# Patient Record
Sex: Female | Born: 1955 | Race: White | Hispanic: No | Marital: Single | Smoking: Former smoker
Health system: Southern US, Community
[De-identification: ages and names within clinical notes are randomized; demographics above are authoritative.]

## PROBLEM LIST (undated history)

## (undated) DIAGNOSIS — G709 Myoneural disorder, unspecified: Secondary | ICD-10-CM

## (undated) DIAGNOSIS — K219 Gastro-esophageal reflux disease without esophagitis: Secondary | ICD-10-CM

## (undated) DIAGNOSIS — K8020 Calculus of gallbladder without cholecystitis without obstruction: Secondary | ICD-10-CM

## (undated) DIAGNOSIS — F1020 Alcohol dependence, uncomplicated: Secondary | ICD-10-CM

## (undated) DIAGNOSIS — I219 Acute myocardial infarction, unspecified: Secondary | ICD-10-CM

## (undated) DIAGNOSIS — M1990 Unspecified osteoarthritis, unspecified site: Secondary | ICD-10-CM

## (undated) DIAGNOSIS — E785 Hyperlipidemia, unspecified: Secondary | ICD-10-CM

## (undated) DIAGNOSIS — H269 Unspecified cataract: Secondary | ICD-10-CM

## (undated) HISTORY — PX: OTHER SURGICAL HISTORY: SHX169

## (undated) HISTORY — DX: Unspecified osteoarthritis, unspecified site: M19.90

## (undated) HISTORY — DX: Unspecified cataract: H26.9

## (undated) HISTORY — DX: Hyperlipidemia, unspecified: E78.5

## (undated) HISTORY — DX: Alcohol dependence, uncomplicated: F10.20

## (undated) HISTORY — DX: Myoneural disorder, unspecified: G70.9

## (undated) HISTORY — DX: Gastro-esophageal reflux disease without esophagitis: K21.9

## (undated) HISTORY — DX: Acute myocardial infarction, unspecified: I21.9

## (undated) HISTORY — PX: TONSILLECTOMY: SUR1361

## (undated) HISTORY — PX: CATARACT EXTRACTION: SUR2

## (undated) HISTORY — DX: Calculus of gallbladder without cholecystitis without obstruction: K80.20

## (undated) HISTORY — PX: COLONOSCOPY: SHX174

## (undated) HISTORY — PX: CHOLECYSTECTOMY: SHX55

---

## 2016-07-04 ENCOUNTER — Telehealth: Payer: Self-pay

## 2016-07-04 NOTE — Telephone Encounter (Signed)
Pre visit call completed 

## 2016-07-06 ENCOUNTER — Ambulatory Visit (INDEPENDENT_AMBULATORY_CARE_PROVIDER_SITE_OTHER): Payer: PRIVATE HEALTH INSURANCE | Admitting: Medical

## 2016-07-06 ENCOUNTER — Ambulatory Visit (HOSPITAL_BASED_OUTPATIENT_CLINIC_OR_DEPARTMENT_OTHER)
Admission: RE | Admit: 2016-07-06 | Discharge: 2016-07-06 | Disposition: A | Discharge status: Home / Self Care | Payer: No Typology Code available for payment source | Source: Ambulatory Visit | Attending: Medical | Admitting: Medical

## 2016-07-06 ENCOUNTER — Encounter: Payer: Self-pay | Admitting: Medical

## 2016-07-06 ENCOUNTER — Telehealth: Payer: Self-pay | Admitting: Medical

## 2016-07-06 VITALS — BP 145/71 | HR 68 | Temp 98.0°F | Resp 16 | Ht 63.0 in | Wt 132.8 lb

## 2016-07-06 DIAGNOSIS — J189 Pneumonia, unspecified organism: Secondary | ICD-10-CM

## 2016-07-06 DIAGNOSIS — R918 Other nonspecific abnormal finding of lung field: Secondary | ICD-10-CM | POA: Insufficient documentation

## 2016-07-06 DIAGNOSIS — R0781 Pleurodynia: Secondary | ICD-10-CM | POA: Insufficient documentation

## 2016-07-06 DIAGNOSIS — I700 Atherosclerosis of aorta: Secondary | ICD-10-CM | POA: Diagnosis not present

## 2016-07-06 DIAGNOSIS — F17200 Nicotine dependence, unspecified, uncomplicated: Secondary | ICD-10-CM | POA: Diagnosis not present

## 2016-07-06 DIAGNOSIS — J984 Other disorders of lung: Secondary | ICD-10-CM

## 2016-07-06 DIAGNOSIS — R0789 Other chest pain: Secondary | ICD-10-CM

## 2016-07-06 DIAGNOSIS — J181 Lobar pneumonia, unspecified organism: Secondary | ICD-10-CM

## 2016-07-06 LAB — D-DIMER, QUANTITATIVE: D-Dimer, Quant: 0.46 mcg/mL FEU (ref <0.50)

## 2016-07-06 LAB — TROPONIN I: TNIDX: 0 ug/l (ref 0.00–0.06)

## 2016-07-06 MED ORDER — DOXYCYCLINE HYCLATE 100 MG PO TABS: 100.0000 mg | ORAL_TABLET | Freq: Two times a day (BID) | ORAL | 0 refills | Status: DC

## 2016-07-06 NOTE — Telephone Encounter (Signed)
I printed pt doxycyline. She will call us and give us name of pharmacy. Will you send in the rx to pharmacy

## 2016-07-06 NOTE — Telephone Encounter (Signed)
Remind pt that we need to do cxr in 3 weeks. Let her know she can go directly to radiology dept down stairs in 3 weeks. Document she was advised. Ask her to follow up with me in at least 3 weeks.

## 2016-07-06 NOTE — Progress Notes (Signed)
Subjective:    Patient ID: Ashley Juarez, female    DOB: 1956/01/12, 61 y.o.   MRN: [id number]  HPI  I have reviewed pt PMH, PSH, FH, Social History and Surgical History  Pt states just moved here.(last pcp was in [place]).  Pt has history of raynauds- describes cold ext at time. No use of amlodipine.  Arthritis- pain mostly in her hands. (mom had arthritis)  Genella Rife- very mild per pt.. Takes omeprazole 1 tab a day.otc dose.  Pt states on Friday she had episode of rt side of chest when she breath deep. Pain was occurring with each breath. Pain started 5 am. Lasted until about 12 noon(not returned since then). She took ibuprofen  2 tablet 6 am and 2 around 11 am. Pt states pain has not come back. No left side pain. At time of pain no nausea, no vomiting, left jaw pain, left shoulder pain or arm pain. At the time she had painful breathing she thought heart was racing. Pt did not get seen. New to area and she was concerned UC and other facilities would not take her insurance so she did not get evaluated.   Pt does not history of partial collapsed lung. This was following pneumonia.          Review of Systems  Constitutional: Negative for chills, fatigue and fever.  HENT: Positive for congestion and postnasal drip. Negative for ear pain.   Respiratory: Negative for cough, choking, shortness of breath and wheezing.        Pleuritic pain and short of breath on friday  Cardiovascular: Positive for chest pain. Negative for palpitations.  Gastrointestinal: Negative for abdominal pain and anal bleeding.  Musculoskeletal: Negative for back pain and gait problem.  Skin: Negative for rash.  Neurological: Negative for dizziness and headaches.  Hematological: Negative for adenopathy. Does not bruise/bleed easily.  Psychiatric/Behavioral: Negative for behavioral problems and confusion.    Past Medical History:  Diagnosis Date  . Arthritis   . Cataract   . GERD (gastroesophageal reflux  disease)      Social History   Social History  . Marital status: Single    Spouse name: N/A  . Number of children: N/A  . Years of education: N/A   Occupational History  . Not on file.   Social History Main Topics  . Smoking status: Current Every Day Smoker    Types: Cigarettes  . Smokeless tobacco: Never Used  . Alcohol use Yes     Comment: daily use. 2-3 glasses at nght.  . Drug use: No  . Sexual activity: Not on file   Other Topics Concern  . Not on file   Social History Narrative  . No narrative on file    Past Surgical History:  Procedure Laterality Date  . CATARACT EXTRACTION Bilateral   . CHOLECYSTECTOMY    . Fatty Tumor     Removed from left ankle.  . TONSILLECTOMY      Family History  Problem Relation Age of Onset  . Heart disease Mother   . Heart disease Father     No Known Allergies  No current outpatient prescriptions on file prior to visit.   No current facility-administered medications on file prior to visit.     BP (!) 145/71 (BP Location: Left Arm, Patient Position: Sitting, Cuff Size: Normal)   Pulse 68   Temp 98 F (36.7 C) (Other (Comment))   Resp 16   Ht 5\' 3"  (1.6 m)  Wt 132 lb 12.8 oz (60.2 kg)   SpO2 99%   BMI 23.52 kg/m      Objective:   Physical Exam   General  Mental Status - Alert. General Appearance - Well groomed. Not in acute distress.  Skin Rashes- No Rashes.  HEENT Head- Normal. Ear Auditory Canal - Left- Normal. Right - Normal.Tympanic Membrane- Left- Normal. Right- Normal. Eye Sclera/Conjunctiva- Left- Normal. Right- Normal. Nose & Sinuses Nasal Mucosa- Left-  Boggy and Congested. Right-  Boggy and  Congested.Bilateral maxillary and frontal sinus pressure. Mouth & Throat Lips: Upper Lip- Normal: no dryness, cracking, pallor, cyanosis, or vesicular eruption. Lower Lip-Normal: no dryness, cracking, pallor, cyanosis or vesicular eruption. Buccal Mucosa- Bilateral- No Aphthous ulcers. Oropharynx- No  Discharge or Erythema. Tonsils: Characteristics- Bilateral- No Erythema or Congestion. Size/Enlargement- Bilateral- No enlargement. Discharge- bilateral-None.  Neck Neck- Supple. No Masses.   Chest and Lung Exam Auscultation: Breath Sounds:-Clear even and unlabored.(on walk  Down hall o2 sat sustained 97%. Pulse 68) After walk about the same.  Cardiovascular Auscultation:Rythm- Regular, rate and rhythm. Murmurs & Other Heart Sounds:Ausculatation of the heart reveal- No Murmurs.  Lymphatic Head & Neck General Head & Neck Lymphatics: Bilateral: Description- No Localized lymphadenopathy.  Lower ext- no pedal edema, calfs symmetric. Negative homans.  Anterior thorax- no chest wall pain on palpation.      Assessment & Plan:  [phone]. For your recent painful breath, hx of lung collapse one year ago, and fast heart rate on Friday we did ekg today. ekg showed good/normal rhythm. Will get cxr today, troponin level and d-dimer.  If you have recurrent symptoms as before then ED evaluation.  We will call you with results. If d-dimer + then will schedule ct chest and US of legs.(provided still aysmptomatic)  If troponin + will advise ED evaluation.  Please stop smoking/reduce.  Follow up in one week or as needed(8-9 am or 1-2 pm recommended appointment slots)

## 2016-07-06 NOTE — Patient Instructions (Addendum)
For your recent painful breath, hx of lung collapse one year ago, and fast heart rate on Friday we did ekg today. ekg showed good/normal rhythm. Will get cxr today, troponin level and d-dimer.  If you have recurrent symptoms as before then ED evaluation.  We will call you with results. If d-dimer + then will schedule ct chest and US of legs.(provided still aysmptomatic)  If troponin + will advise ED evaluation.  Please stop smoking/reduce.  Follow up in one week or as needed(8-9 am or 1-2 pm recommended appointment slots)

## 2016-07-06 NOTE — Progress Notes (Signed)
Pre visit review using our clinic review tool, if applicable. No additional management support is needed unless otherwise documented below in the visit note. 

## 2016-07-07 NOTE — Telephone Encounter (Signed)
Pt request [facility] pharmacy  [address], [place], [place] [id number].   Rx faxed to pharmacy [phone]

## 2016-07-07 NOTE — Telephone Encounter (Signed)
Notified pt. Pt voiced understanding.  

## 2016-07-11 ENCOUNTER — Telehealth: Payer: Self-pay | Admitting: Medical

## 2016-07-11 MED ORDER — DOXYCYCLINE HYCLATE 100 MG PO TABS: 100.0000 mg | ORAL_TABLET | Freq: Two times a day (BID) | ORAL | 0 refills | Status: DC

## 2016-07-11 MED FILL — DOXYCYCLINE HYCLATE 100 MG: 100 | 10 days supply | Qty: 20 | Fill #0

## 2016-07-11 NOTE — Telephone Encounter (Signed)
Caller name:Portia Brittian Relationship to patient: Can be reached:[phone] Pharmacy:[facility] [address]  Reason for call:unable to afford Doxycycline, request something else be called in

## 2016-07-11 NOTE — Telephone Encounter (Signed)
Sent to our pharmacy. I talked with pharmacy and they can fill for $9. Notified pt and she will pick up the rx tomorrow morning.

## 2016-07-14 ENCOUNTER — Ambulatory Visit: Payer: PRIVATE HEALTH INSURANCE | Admitting: Medical

## 2016-07-29 ENCOUNTER — Ambulatory Visit (HOSPITAL_BASED_OUTPATIENT_CLINIC_OR_DEPARTMENT_OTHER)
Admission: RE | Admit: 2016-07-29 | Discharge: 2016-07-29 | Disposition: A | Discharge status: Home / Self Care | Payer: No Typology Code available for payment source | Source: Ambulatory Visit | Attending: Medical | Admitting: Medical

## 2016-07-29 DIAGNOSIS — J181 Lobar pneumonia, unspecified organism: Secondary | ICD-10-CM | POA: Insufficient documentation

## 2016-07-29 DIAGNOSIS — J189 Pneumonia, unspecified organism: Secondary | ICD-10-CM

## 2016-07-29 DIAGNOSIS — J984 Other disorders of lung: Secondary | ICD-10-CM | POA: Diagnosis present

## 2018-04-17 ENCOUNTER — Encounter: Payer: Self-pay | Admitting: Family Medicine

## 2018-04-17 ENCOUNTER — Ambulatory Visit: Payer: Self-pay | Admitting: *Deleted

## 2018-04-17 ENCOUNTER — Ambulatory Visit
Admission: EM | Admit: 2018-04-17 | Discharge: 2018-04-17 | Disposition: A | Discharge status: Home / Self Care | Payer: BLUE CROSS/BLUE SHIELD | Attending: Family Medicine | Admitting: Family Medicine

## 2018-04-17 ENCOUNTER — Ambulatory Visit (INDEPENDENT_AMBULATORY_CARE_PROVIDER_SITE_OTHER): Payer: BLUE CROSS/BLUE SHIELD

## 2018-04-17 DIAGNOSIS — S29011A Strain of muscle and tendon of front wall of thorax, initial encounter: Secondary | ICD-10-CM

## 2018-04-17 MED ORDER — CYCLOBENZAPRINE HCL 5 MG PO TABS: 5.0000 mg | ORAL_TABLET | Freq: Every day | ORAL | 0 refills | Status: DC

## 2018-04-17 MED ORDER — BENZONATATE 100 MG PO CAPS: 100.0000 mg | ORAL_CAPSULE | Freq: Three times a day (TID) | ORAL | 0 refills | Status: DC | PRN

## 2018-04-17 MED ORDER — PREDNISONE 20 MG PO TABS: ORAL_TABLET | ORAL | 0 refills | Status: DC

## 2018-04-17 MED ORDER — OSELTAMIVIR PHOSPHATE 75 MG PO CAPS: 75.0000 mg | ORAL_CAPSULE | Freq: Two times a day (BID) | ORAL | 0 refills | Status: DC

## 2018-04-17 NOTE — Telephone Encounter (Signed)
Patient is calling to report she is having pain in R lung. Patient thought she had pulled muscle and has been using heating pad- not better. Patient state she has been having the pain since last TH/FR. Patient refuses to go to ED- call to office- per [place] any provider in practice would send patient downstairs to ED- patient not pleased with disposition and states she is trying to avoid a big bill- she hung up on me.   Reason for Disposition . Taking a deep breath makes pain worse  Answer Assessment - Initial Assessment Questions 1. LOCATION: "Where does it hurt?"       R side- in side 2. RADIATION: "Does the pain go anywhere else?" (e.g., into neck, jaw, arms, back)     No 3. ONSET: "When did the chest pain begin?" (Minutes, hours or days)      TH/FR 4. PATTERN "Does the pain come and go, or has it been constant since it started?"  "Does it get worse with exertion?"      Comes and goes- only with deep breath, no 5. DURATION: "How long does it last" (e.g., seconds, minutes, hours)     Momentary when patient takes deep breath 6. SEVERITY: "How bad is the pain?"  (e.g., Scale 1-10; mild, moderate, or severe)    - MILD (1-3): doesn't interfere with normal activities     - MODERATE (4-7): interferes with normal activities or awakens from sleep    - SEVERE (8-10): excruciating pain, unable to do any normal activities       5-6 7. CARDIAC RISK FACTORS: "Do you have any history of heart problems or risk factors for heart disease?" (e.g., prior heart attack, angina; high blood pressure, diabetes, being overweight, high cholesterol, smoking, or strong family history of heart disease)     no 8. PULMONARY RISK FACTORS: "Do you have any history of lung disease?"  (e.g., blood clots in lung, asthma, emphysema, birth control pills)     Hx of partially collapsed lung on same side 9. CAUSE: "What do you think is causing the chest pain?"     Possible reoccurrence of lung collapse 10. OTHER SYMPTOMS:  "Do you have any other symptoms?" (e.g., dizziness, nausea, vomiting, sweating, fever, difficulty breathing, cough)       No- pain with deep breath only 11. PREGNANCY: "Is there any chance you are pregnant?" "When was your last menstrual period?"       n/a  Protocols used: CHEST PAIN-A-AH

## 2018-04-17 NOTE — ED Triage Notes (Signed)
Pt c/o rt side muscle strain since last Friday after moving a dresser

## 2018-04-17 NOTE — Telephone Encounter (Signed)
Reviewed chart and she was seen in ED today.

## 2018-04-17 NOTE — ED Provider Notes (Signed)
[facility]    CSN: [id number] Arrival date & time: 04/17/18  1304     History   Chief Complaint Chief Complaint  Patient presents with  . muscle strain    HPI Ashley Juarez is a 63 y.o. female.   63 yo ex-smoker making initial visit to [facility] with right sided chest pain, worse with deep breath.  She quit smoking 4 months ago.  Has dry cough.  Chest pain worse with deep breath.  No fever or productive cough.  No fever or shortness of breath.  Chest pain onset associated with lifting furniture.  Patient has h/o pneumothorax.     Past Medical History:  Diagnosis Date  . Arthritis   . Cataract   . GERD (gastroesophageal reflux disease)     There are no active problems to display for this patient.   Past Surgical History:  Procedure Laterality Date  . CATARACT EXTRACTION Bilateral   . CHOLECYSTECTOMY    . Fatty Tumor     Removed from left ankle.  . TONSILLECTOMY      OB History   No obstetric history on file.      Home Medications    Prior to Admission medications   Medication Sig Start Date End Date Taking? Authorizing Provider  benzonatate (TESSALON) 100 MG capsule Take 1-2 capsules (100-200 mg total) by mouth 3 (three) times daily as needed for cough. 04/17/18   Elvina Sidle, MD  cyclobenzaprine (FLEXERIL) 5 MG tablet Take 1 tablet (5 mg total) by mouth at bedtime. 04/17/18   Elvina Sidle, MD  oseltamivir (TAMIFLU) 75 MG capsule Take 1 capsule (75 mg total) by mouth every 12 (twelve) hours. 04/17/18   Elvina Sidle, MD  predniSONE (DELTASONE) 20 MG tablet Two daily with food 04/17/18   Elvina Sidle, MD    Family History Family History  Problem Relation Age of Onset  . Heart disease Mother   . Heart disease Father     Social History Social History   Tobacco Use  . Smoking status: Current Every Day Smoker    Packs/day: 1.50    Types: Cigarettes  . Smokeless tobacco: Never Used  Substance Use Topics  . Alcohol  use: Yes    Comment: daily use. 2-3 glasses at nght.  . Drug use: No     Allergies   Patient has no known allergies.   Review of Systems Review of Systems   Physical Exam Triage Vital Signs ED Triage Vitals  Enc Vitals Group     BP 04/17/18 1311 (!) 179/92     Pulse Rate 04/17/18 1311 83     Resp 04/17/18 1311 18     Temp 04/17/18 1311 (!) 97.5 F (36.4 C)     Temp Source 04/17/18 1311 Oral     SpO2 04/17/18 1311 96 %     Weight --      Height --      Head Circumference --      Peak Flow --      Pain Score 04/17/18 1312 5     Pain Loc --      Pain Edu? --      Excl. in [place]? --    No data found.  Updated Vital Signs BP (!) 157/79 (BP Location: Left Arm)   Pulse 83   Temp (!) 97.5 F (36.4 C) (Oral)   Resp 18   SpO2 96%    Physical Exam Vitals signs and nursing note reviewed.  Constitutional:      General: She is not in acute distress.    Appearance: Normal appearance. She is normal weight. She is not ill-appearing or toxic-appearing.  HENT:     Head: Normocephalic.     Right Ear: Tympanic membrane normal.     Left Ear: Tympanic membrane normal.     Mouth/Throat:     Mouth: Mucous membranes are moist.  Eyes:     Conjunctiva/sclera: Conjunctivae normal.     Pupils: Pupils are equal, round, and reactive to light.  Cardiovascular:     Rate and Rhythm: Normal rate and regular rhythm.     Pulses: Normal pulses.     Heart sounds: Normal heart sounds.  Pulmonary:     Effort: Pulmonary effort is normal.     Breath sounds: Normal breath sounds.  Musculoskeletal: Normal range of motion.  Skin:    General: Skin is warm and dry.  Neurological:     General: No focal deficit present.     Mental Status: She is alert and oriented to person, place, and time.  Psychiatric:        Mood and Affect: Mood normal.        Behavior: Behavior normal.      UC Treatments / Results  Labs (all labs ordered are listed, but only abnormal results are displayed) Labs  Reviewed - No data to display  EKG None  Radiology No results found.  Procedures Procedures (including critical care time)  Medications Ordered in UC Medications - No data to display  Initial Impression / Assessment and Plan / UC Course  I have reviewed the triage vital signs and the nursing notes.  Pertinent labs & imaging results that were available during my care of the patient were reviewed by me and considered in my medical decision making (see chart for details).     Final Clinical Impressions(s) / UC Diagnoses   Final diagnoses:  Muscle strain of chest wall, initial encounter   Discharge Instructions   None    ED Prescriptions    Medication Sig Dispense Auth. Provider   benzonatate (TESSALON) 100 MG capsule Take 1-2 capsules (100-200 mg total) by mouth 3 (three) times daily as needed for cough. 40 capsule Elvina Sidle, MD   cyclobenzaprine (FLEXERIL) 5 MG tablet Take 1 tablet (5 mg total) by mouth at bedtime. 7 tablet Elvina Sidle, MD   predniSONE (DELTASONE) 20 MG tablet Two daily with food 10 tablet Elvina Sidle, MD   oseltamivir (TAMIFLU) 75 MG capsule Take 1 capsule (75 mg total) by mouth every 12 (twelve) hours. 10 capsule Elvina Sidle, MD     Controlled Substance Prescriptions [place] Controlled Substance Registry consulted? Not Applicable   Elvina Sidle, MD 04/17/18 [phone]

## 2018-08-24 ENCOUNTER — Telehealth: Payer: Self-pay | Admitting: Emergency Medicine

## 2018-08-24 MED ORDER — BENZONATATE 100 MG PO CAPS: 100.0000 mg | ORAL_CAPSULE | Freq: Three times a day (TID) | ORAL | 0 refills | Status: DC | PRN

## 2018-08-24 NOTE — Telephone Encounter (Signed)
Patient called requesting a refill on her medications from her visit in February.  Reviewed with APP [facility]) and discussed with patient.  Will refill Tessalon, discussed return precautions and indications for possible steroid, and need to be seen if she believes she might need it.  Patient verbalized understanding, pharmacy confirmed, and prescription sent.

## 2018-09-18 ENCOUNTER — Ambulatory Visit
Admission: EM | Admit: 2018-09-18 | Discharge: 2018-09-18 | Disposition: A | Discharge status: Home / Self Care | Payer: BLUE CROSS/BLUE SHIELD | Attending: Physician Assistant | Admitting: Physician Assistant

## 2018-09-18 ENCOUNTER — Other Ambulatory Visit: Payer: Self-pay

## 2018-09-18 DIAGNOSIS — R0789 Other chest pain: Secondary | ICD-10-CM

## 2018-09-18 MED ORDER — HYDROCOD POLST-CPM POLST ER 10-8 MG/5ML PO SUER: 5.0000 mL | Freq: Every evening | ORAL | 0 refills | Status: DC | PRN

## 2018-09-18 MED ORDER — PREDNISONE 50 MG PO TABS: 50.0000 mg | ORAL_TABLET | Freq: Every day | ORAL | 0 refills | Status: DC

## 2018-09-18 MED ORDER — BENZONATATE 100 MG PO CAPS: 100.0000 mg | ORAL_CAPSULE | Freq: Three times a day (TID) | ORAL | 0 refills | Status: DC

## 2018-09-18 MED ORDER — CYCLOBENZAPRINE HCL 5 MG PO TABS: 5.0000 mg | ORAL_TABLET | Freq: Two times a day (BID) | ORAL | 0 refills | Status: AC | PRN
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
## 2018-09-18 NOTE — Discharge Instructions (Addendum)
No alarming signs on exam. Start tessalon for cough. Tussionex as needed for cough at night. Prednisone for inflammation. Flexeril as needed, this can make you drowsy, so do not take if you are going to drive, operate heavy machinery, or make important decisions. Ice/heat compresses as needed. This can take up to 3-4 weeks to completely resolve, but you should be feeling better each week. If you experience sudden worsening of chest pain, or sudden onset of shortness of breath, go to the ED for further evaluation needed.
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
## 2018-09-20 ENCOUNTER — Telehealth: Payer: Self-pay | Admitting: Emergency Medicine
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
## 2018-09-23 ENCOUNTER — Other Ambulatory Visit: Payer: Self-pay

## 2018-09-23 ENCOUNTER — Emergency Department (HOSPITAL_BASED_OUTPATIENT_CLINIC_OR_DEPARTMENT_OTHER): Payer: BLUE CROSS/BLUE SHIELD

## 2018-09-23 ENCOUNTER — Emergency Department (HOSPITAL_BASED_OUTPATIENT_CLINIC_OR_DEPARTMENT_OTHER)
Admission: EM | Admit: 2018-09-23 | Discharge: 2018-09-23 | Disposition: A | Discharge status: Home / Self Care | Payer: BLUE CROSS/BLUE SHIELD | Attending: Emergency Medicine | Admitting: Emergency Medicine

## 2018-09-23 ENCOUNTER — Encounter (HOSPITAL_BASED_OUTPATIENT_CLINIC_OR_DEPARTMENT_OTHER): Payer: Self-pay | Admitting: Emergency Medicine

## 2018-09-23 DIAGNOSIS — Z87891 Personal history of nicotine dependence: Secondary | ICD-10-CM | POA: Diagnosis not present

## 2018-09-23 DIAGNOSIS — R0789 Other chest pain: Secondary | ICD-10-CM | POA: Insufficient documentation

## 2018-09-23 DIAGNOSIS — Z79899 Other long term (current) drug therapy: Secondary | ICD-10-CM | POA: Insufficient documentation

## 2018-09-23 DIAGNOSIS — R079 Chest pain, unspecified: Secondary | ICD-10-CM | POA: Diagnosis present

## 2018-09-23 MED ORDER — HYDROCODONE-ACETAMINOPHEN 5-325 MG PO TABS: 1.0000 | ORAL_TABLET | Freq: Four times a day (QID) | ORAL | 0 refills | Status: DC | PRN

## 2018-09-23 MED ORDER — BENZONATATE 100 MG PO CAPS: 100.0000 mg | ORAL_CAPSULE | Freq: Three times a day (TID) | ORAL | 0 refills | Status: AC

## 2018-09-23 NOTE — Discharge Instructions (Signed)
Continue taking the Aleve and try lidocaine patches or heating pad.  Also you can use the pain medicine as needed.  If you develop worsening pain, shortness of breath, fever or vomiting you need to follow-up with your doctor sooner or return to the emergency room.

## 2018-09-23 NOTE — ED Notes (Signed)
Went to discharge patient, pt requesting tessalon perles and cough medicine to help her sleep. MD made aware at this time.

## 2018-09-23 NOTE — ED Notes (Signed)
ED Provider at bedside. 
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
## 2018-09-25 ENCOUNTER — Emergency Department (HOSPITAL_BASED_OUTPATIENT_CLINIC_OR_DEPARTMENT_OTHER)
Admission: EM | Admit: 2018-09-25 | Discharge: 2018-09-25 | Disposition: A | Discharge status: Home / Self Care | Payer: BLUE CROSS/BLUE SHIELD | Attending: Emergency Medicine | Admitting: Emergency Medicine

## 2018-09-25 ENCOUNTER — Other Ambulatory Visit: Payer: Self-pay

## 2018-09-25 ENCOUNTER — Encounter (HOSPITAL_BASED_OUTPATIENT_CLINIC_OR_DEPARTMENT_OTHER): Payer: Self-pay | Admitting: *Deleted

## 2018-09-25 DIAGNOSIS — R109 Unspecified abdominal pain: Secondary | ICD-10-CM | POA: Insufficient documentation

## 2018-09-25 DIAGNOSIS — Z5321 Procedure and treatment not carried out due to patient leaving prior to being seen by health care provider: Secondary | ICD-10-CM | POA: Insufficient documentation

## 2018-09-25 DIAGNOSIS — R1084 Generalized abdominal pain: Secondary | ICD-10-CM
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
## 2018-09-25 NOTE — ED Provider Notes (Signed)
Attempted to see pt in room x3 and she was not in the room. I was informed by nursing staff that patient left the ED. I did not establish care with her.    Bishop Dublin 09/25/18 [place], Julie, MD 09/30/18 [phone]

## 2018-09-25 NOTE — ED Notes (Signed)
Patient left while she was in the room stating that she don't want to wait anymore.

## 2018-09-26 ENCOUNTER — Ambulatory Visit (INDEPENDENT_AMBULATORY_CARE_PROVIDER_SITE_OTHER): Payer: BLUE CROSS/BLUE SHIELD | Admitting: Medical

## 2018-09-26 ENCOUNTER — Encounter: Payer: Self-pay | Admitting: Medical

## 2018-09-26 VITALS — BP 164/98 | HR 91 | Temp 97.7°F | Resp 16 | Ht 63.0 in | Wt 124.4 lb

## 2018-09-26 DIAGNOSIS — R1013 Epigastric pain: Secondary | ICD-10-CM | POA: Diagnosis not present

## 2018-09-26 DIAGNOSIS — M940 Chondrocostal junction syndrome [Tietze]: Secondary | ICD-10-CM | POA: Diagnosis not present

## 2018-09-26 LAB — COMPREHENSIVE METABOLIC PANEL
ALT: 43 U/L — ABNORMAL HIGH (ref 0–35)
AST: 33 U/L (ref 0–37)
Albumin: 4.2 g/dL (ref 3.5–5.2)
Alkaline Phosphatase: 58 U/L (ref 39–117)
BUN: 17 mg/dL (ref 6–23)
CO2: 26 mEq/L (ref 19–32)
Calcium: 9.8 mg/dL (ref 8.4–10.5)
Chloride: 94 mEq/L — ABNORMAL LOW (ref 96–112)
Creatinine, Ser: 0.83 mg/dL (ref 0.40–1.20)
GFR: 69.81 mL/min (ref >60.00)
Glucose, Bld: 104 mg/dL — ABNORMAL HIGH (ref 70–99)
Potassium: 3.4 mEq/L — ABNORMAL LOW (ref 3.5–5.1)
Sodium: 131 mEq/L — ABNORMAL LOW (ref 135–145)
Total Bilirubin: 0.4 mg/dL (ref 0.2–1.2)
Total Protein: 7.8 g/dL (ref 6.0–8.3)

## 2018-09-26 LAB — CBC WITH DIFFERENTIAL/PLATELET
Basophils Absolute: 0 10*3/uL (ref 0.0–0.1)
Basophils Relative: 0.3 % (ref 0.0–3.0)
Eosinophils Absolute: 0.1 10*3/uL (ref 0.0–0.7)
Eosinophils Relative: 0.9 % (ref 0.0–5.0)
HCT: 48.8 % — ABNORMAL HIGH (ref 36.0–46.0)
Hemoglobin: 16.8 g/dL — ABNORMAL HIGH (ref 12.0–15.0)
Lymphocytes Relative: 8.9 % — ABNORMAL LOW (ref 12.0–46.0)
Lymphs Abs: 0.9 10*3/uL (ref 0.7–4.0)
MCHC: 34.4 g/dL (ref 30.0–36.0)
MCV: 108.5 fl — ABNORMAL HIGH (ref 78.0–100.0)
Monocytes Absolute: 0.9 10*3/uL (ref 0.1–1.0)
Monocytes Relative: 9 % (ref 3.0–12.0)
Neutro Abs: 8.3 10*3/uL — ABNORMAL HIGH (ref 1.4–7.7)
Neutrophils Relative %: 80.9 % — ABNORMAL HIGH (ref 43.0–77.0)
Platelets: 232 10*3/uL (ref 150.0–400.0)
RBC: 4.5 Mil/uL (ref 3.87–5.11)
RDW: 13.1 % (ref 11.5–15.5)
WBC: 10.2 10*3/uL (ref 4.0–10.5)

## 2018-09-26 LAB — AMYLASE: Amylase: 19 U/L — ABNORMAL LOW (ref 27–131)

## 2018-09-26 LAB — LIPASE: Lipase: 8 U/L — ABNORMAL LOW (ref 11.0–59.0)

## 2018-09-26 MED ORDER — FAMOTIDINE 20 MG PO TABS: 20.0000 mg | ORAL_TABLET | Freq: Every day | ORAL | 3 refills | Status: AC

## 2018-09-26 MED ORDER — FAMOTIDINE 20 MG PO TABS
20.0000 mg | ORAL_TABLET | Freq: Every day | ORAL | 3 refills | Status: DC
Start: 2018-09-26 — End: 2018-09-26

## 2018-09-26 MED FILL — FAMOTIDINE 20 MG TABLET: 20 | 30 days supply | Qty: 30 | Fill #0
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
## 2018-09-27 ENCOUNTER — Telehealth: Payer: Self-pay | Admitting: Medical

## 2018-09-27 DIAGNOSIS — R1013 Epigastric pain: Secondary | ICD-10-CM

## 2018-09-27 MED ORDER — SUCRALFATE 1 G PO TABS: 1.0000 g | ORAL_TABLET | Freq: Three times a day (TID) | ORAL | 0 refills | Status: AC

## 2018-09-27 NOTE — Telephone Encounter (Signed)
Referral to gi placed. °

## 2018-09-28 ENCOUNTER — Encounter (HOSPITAL_BASED_OUTPATIENT_CLINIC_OR_DEPARTMENT_OTHER): Payer: Self-pay

## 2018-09-28 ENCOUNTER — Telehealth: Payer: Self-pay | Admitting: Medical

## 2018-09-28 ENCOUNTER — Other Ambulatory Visit: Payer: Self-pay

## 2018-09-28 ENCOUNTER — Telehealth: Payer: Self-pay | Admitting: *Deleted

## 2018-09-28 ENCOUNTER — Emergency Department (HOSPITAL_BASED_OUTPATIENT_CLINIC_OR_DEPARTMENT_OTHER)
Admission: EM | Admit: 2018-09-28 | Discharge: 2018-09-28 | Disposition: A | Discharge status: Home / Self Care | Payer: BLUE CROSS/BLUE SHIELD | Attending: Emergency Medicine | Admitting: Emergency Medicine

## 2018-09-28 ENCOUNTER — Telehealth: Payer: Self-pay | Admitting: Gastroenterology

## 2018-09-28 DIAGNOSIS — Z87891 Personal history of nicotine dependence: Secondary | ICD-10-CM | POA: Diagnosis not present

## 2018-09-28 DIAGNOSIS — R1013 Epigastric pain: Secondary | ICD-10-CM | POA: Diagnosis present

## 2018-09-28 DIAGNOSIS — K2900 Acute gastritis without bleeding: Secondary | ICD-10-CM | POA: Diagnosis not present

## 2018-09-28 DIAGNOSIS — Z79899 Other long term (current) drug therapy: Secondary | ICD-10-CM | POA: Diagnosis not present

## 2018-09-28 LAB — URINALYSIS, ROUTINE W REFLEX MICROSCOPIC
Glucose, UA: NEGATIVE mg/dL
Hgb urine dipstick: NEGATIVE
Ketones, ur: 40 mg/dL — AB
Leukocytes,Ua: NEGATIVE
Nitrite: NEGATIVE
Protein, ur: NEGATIVE mg/dL
Specific Gravity, Urine: >1.03 — ABNORMAL HIGH (ref 1.005–1.030)
pH: 5.5 (ref 5.0–8.0)

## 2018-09-28 MED ORDER — ALUM & MAG HYDROXIDE-SIMETH 200-200-20 MG/5ML PO SUSP
30.0000 mL | Freq: Once | ORAL | Status: AC
  Administered 2018-09-28: 15:00:00 30 mL via ORAL
  Filled 2018-09-28: qty 30

## 2018-09-28 MED ORDER — LIDOCAINE VISCOUS HCL 2 % MT SOLN
15.0000 mL | Freq: Once | OROMUCOSAL | Status: AC
  Administered 2018-09-28: 15:00:00 15 mL via ORAL
  Filled 2018-09-28: qty 15

## 2018-09-28 NOTE — ED Triage Notes (Signed)
Pt c/o abd pain x 6 days-seen by PCP this week-states blood work done-NAD-steady gait

## 2018-09-28 NOTE — Telephone Encounter (Signed)
Dr. Chales AbrahamsGupta had a cancellation on Monday 10/01/2018, called and offered patient that appointment.  She declined and said she was on her way to ED.  I also offered her the appointment just in case she may still need to be seen after being seen in ED and she declined.                  ----- Message ----- From: Chrystie NoseHunt, Linda R, RN Sent: 09/28/2018   9:01 AM EDT To: Charlie PitterHenry L Danis III, MD   Please see [facility] and advise scheduling.  Provider Comments    09/27/2018 1:19 PM  Saguier, Ramon DredgeEdward, PA-C Summary: Provider Comments Attachment: - Note              Pt has recent severe abdomen pain. Hx of gerd. She was on omeprazole. Around time took prednisone for costochondritis and hydrocodone abdomen pain worsened.. Pt had resolution of symptoms for about one hour after GI cocktail. Then symptoms started to return but not as severe as before. Can pt be seen quickly by early next week if possible. Her discomfort is significant. I think needs egd.       ----- Message ----- From: Karna ChristmasHarris, Christy D Sent: 09/28/2018   8:27 AM EDT To: Chrystie NoseLinda R Hunt, RN  Bonita QuinLinda,  Dr. Myrtie Neitheranis was the DOD for 09/27/2018 in the PM. Could you advise scheduling please?  Thanks Neysa BonitoChristy     Date Time Type Summary User  09/28/2018 8:29 AM EDT General Auto: Referral message Karna ChristmasHARRIS, CHRISTY D  Note Text:  ----- Message ----- From: Karna ChristmasHarris, Christy D Sent: 09/28/2018   8:27 AM EDT To: Chrystie NoseLinda R Hunt, RN  Bonita QuinLinda,  Dr. Myrtie Neitheranis was the DOD for 09/27/2018 in the PM. Could you advise scheduling please?  Thanks Neysa Bonitohristy    Date Time Type Summary User  09/27/2018 3:36 PM EDT General Auto: Referral message Terressa KoyanagiGRANT, BRIANA E  Note Text:  ----- Message ----- From: Johnney KillianGrant, Briana E, RN Sent: 09/27/2018   3:34 PM EDT To: Holli Humbleshristy D Harris Subject: DOD not Dr. Barron Alvineirigliano today                   Dr. Myrtie Neitheranis is the DOD today. Do you still want me to ask Dr. Barron Alvineirigliano about this? Thank you Bre  ----- Message  ----- From: Karna ChristmasHarris, Christy D Sent: 09/27/2018   3:13 PM EDT To: Johnney KillianBriana E Grant, RN  Hi Bri,  Will you see attached referral.  Dr. Barron Alvineirigliano is DOD for today and Pt has no gi hx. Please advise scheduling?  Requesting that they be seen as early next week.  Thanks Neysa Bonitohristy     Date Time Type Summary User  09/27/2018 3:14 PM EDT General Auto: Referral message Karna ChristmasHARRIS, CHRISTY D  Note Text:  ----- Message ----- From: Karna ChristmasHarris, Christy D Sent: 09/27/2018   3:13 PM EDT To: Johnney KillianBriana E Grant, RN  Hi Bri,  Will you see attached referral.  Dr. Barron Alvineirigliano is DOD for today and Pt has no gi hx. Please advise scheduling?  Requesting that they be seen as early next week.  Thanks Neysa Bonitohristy    Date Time Type Summary User  09/27/2018 1:19 PM EDT Provider Comments Provider Comments Gwenevere AbbotSAGUIER, EDWARD M  Note Text:  Pt has recent severe abdomen pain. Hx of gerd. She was on omeprazole. Around time took prednisone for costochondritis and hydrocodone abdomen pain worsened.. Pt had resolution of symptoms for about one hour after GI cocktail. Then symptoms started  to return but not as severe as before. Can pt be seen quickly by early next week if possible. Her discomfort is significant. I think needs egd.

## 2018-09-28 NOTE — Discharge Instructions (Addendum)
Pick up the Carafate at your pharmacy and start taking that.  Continue taking the omeprazole.  Continue a bland, low acid diet.  Follow-up next week with the gastroenterologist.  Return to the emergency room if you have any worsening symptoms.

## 2018-09-28 NOTE — Telephone Encounter (Signed)
FYI I spoke with patient this morning about her getting worse and what medications to take.  She called back and states that she has gotten worse and she is now heading to the ER.  I gave her the number to the GI that we referred her to and she stated that they were not able to get her in until next week.

## 2018-09-28 NOTE — ED Notes (Signed)
C/o abd pain x 6 days  Denies n/v/d, denies ua sx,  No vag dc  Has been seen for same  Had blood work done 2 days ago

## 2018-09-28 NOTE — Telephone Encounter (Signed)
Noted  
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
## 2018-09-28 NOTE — Telephone Encounter (Signed)
Copied from [place] [id number]. Topic: General - Inquiry >> Sep 28, 2018  8:35 AM Reyne Dumas L wrote: Reason for CRM:  Pt states she was called with her lab results yesterday and asked the nurse a question.  She states the nurse told her she would be intouch with provider and then call her right back.  Pt hasn't heard anything back and is worried/upset.  Pt would like a call as soon as possible.

## 2018-09-28 NOTE — ED Notes (Signed)
ED Provider at bedside. 

## 2018-09-28 NOTE — Telephone Encounter (Signed)
See lab note.  

## 2018-09-29 NOTE — Telephone Encounter (Signed)
Saw ED note. She should be starting carafate soon. So hopefully this will help pending that appointment.

## 2018-10-01 ENCOUNTER — Encounter: Payer: Self-pay | Admitting: Gastroenterology

## 2018-10-01 ENCOUNTER — Other Ambulatory Visit: Payer: Self-pay

## 2018-10-01 ENCOUNTER — Ambulatory Visit (INDEPENDENT_AMBULATORY_CARE_PROVIDER_SITE_OTHER): Payer: BLUE CROSS/BLUE SHIELD | Admitting: Gastroenterology

## 2018-10-01 ENCOUNTER — Telehealth: Payer: Self-pay

## 2018-10-01 VITALS — BP 130/84 | HR 80 | Temp 97.7°F | Ht 63.0 in | Wt 126.1 lb

## 2018-10-01 DIAGNOSIS — R1013 Epigastric pain: Secondary | ICD-10-CM | POA: Diagnosis not present

## 2018-10-01 NOTE — Patient Instructions (Signed)
If you are age 61 or older, your body mass index should be between 23-30. Your Body mass index is 22.34 kg/m. If this is out of the aforementioned range listed, please consider follow up with your Primary Care Provider.  If you are age 37 or younger, your body mass index should be between 19-25. Your Body mass index is 22.34 kg/m. If this is out of the aformentioned range listed, please consider follow up with your Primary Care Provider.   Please follow up in 6 months.   Thank you,  Dr. Jackquline Denmark
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
## 2018-10-01 NOTE — Telephone Encounter (Signed)
Covid-19 screening questions   Do you now or have you had a fever in the last 14 days? No  Do you have any respiratory symptoms of shortness of breath or cough now or in the last 14 days? No  Do you have any family members or close contacts with diagnosed or suspected Covid-19 in the past 14 days? No  Have you been tested for Covid-19 and found to be positive? No       s 

## 2020-05-18 IMAGING — DX DG CHEST 2V
2 series · 2 of 2 positions shown · non-contrast
Comparison: Radiographs July 29, 2016.

CLINICAL DATA: Chest pain, cough.

EXAM:
CHEST - 2 VIEW

[chest pa]
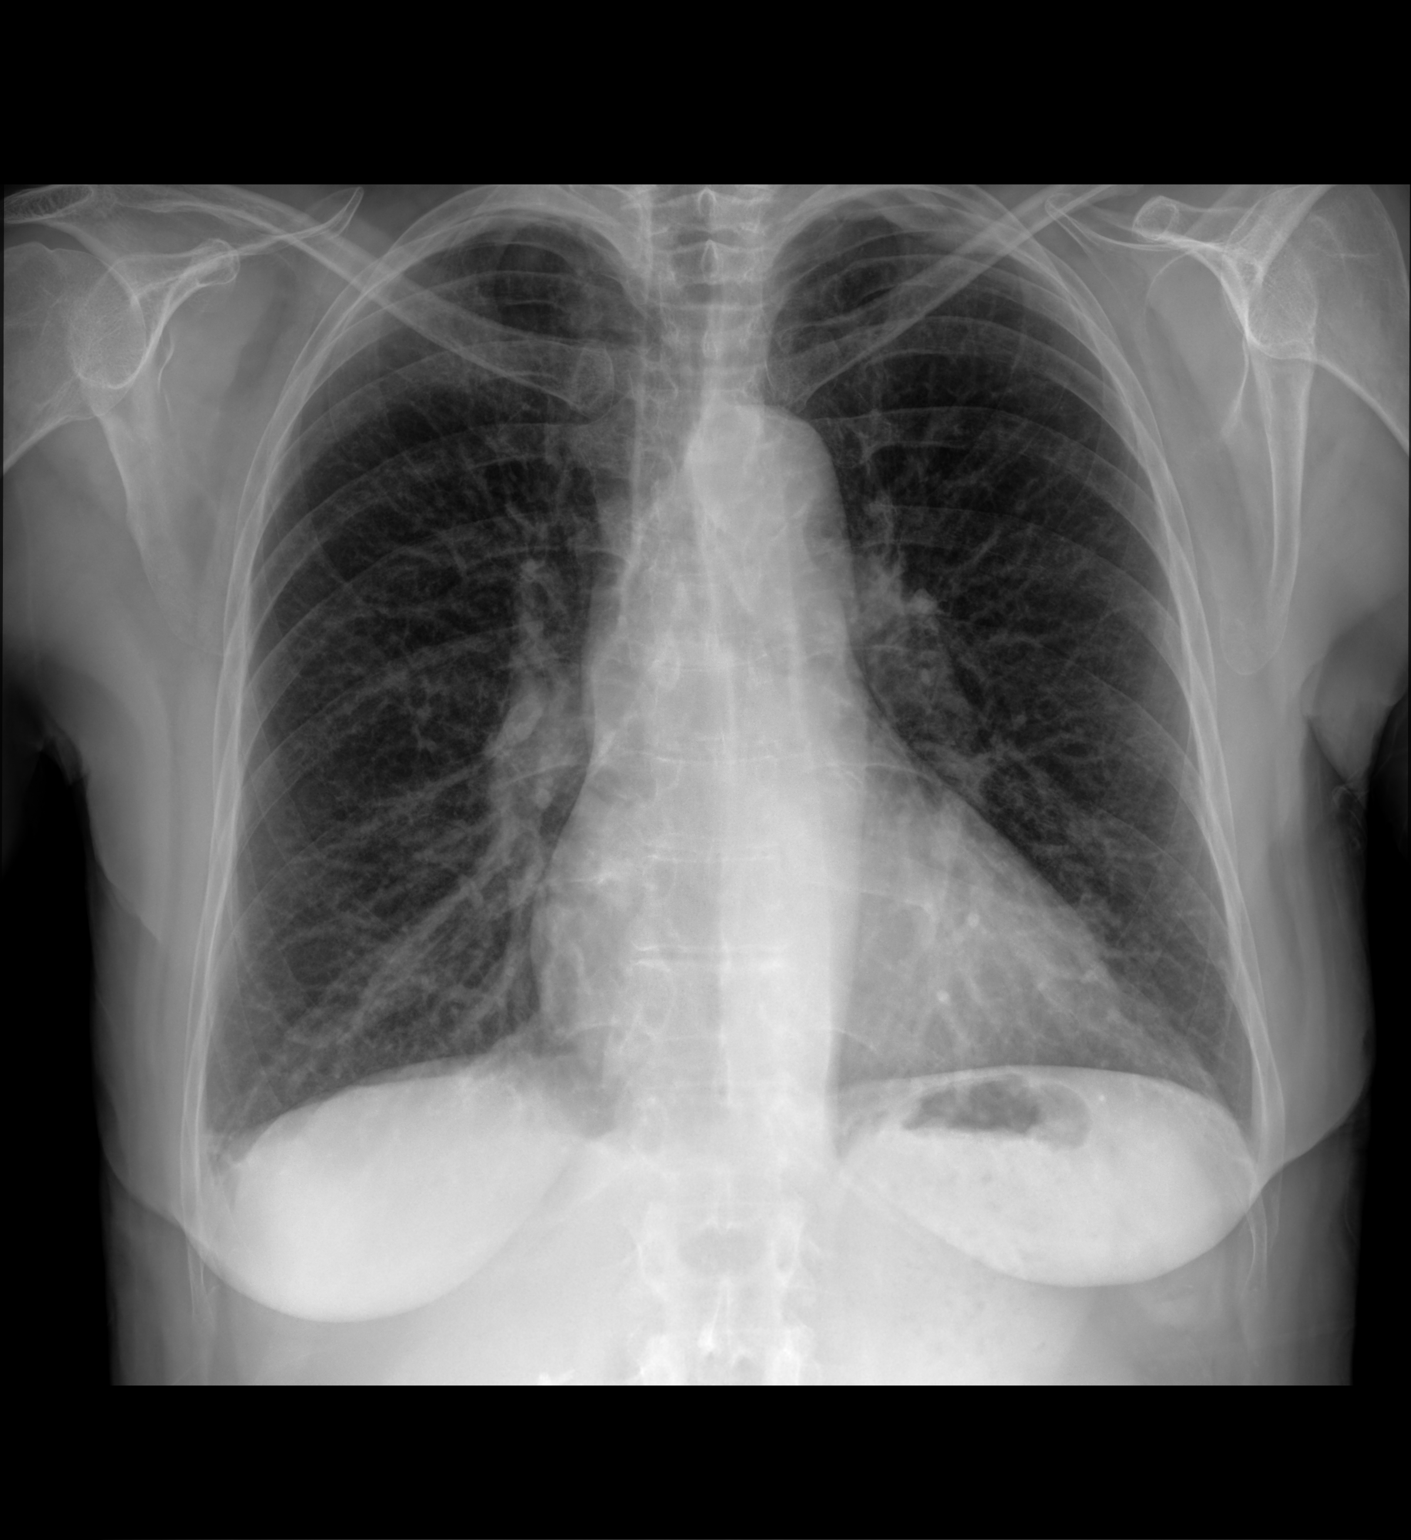

[chest lat]
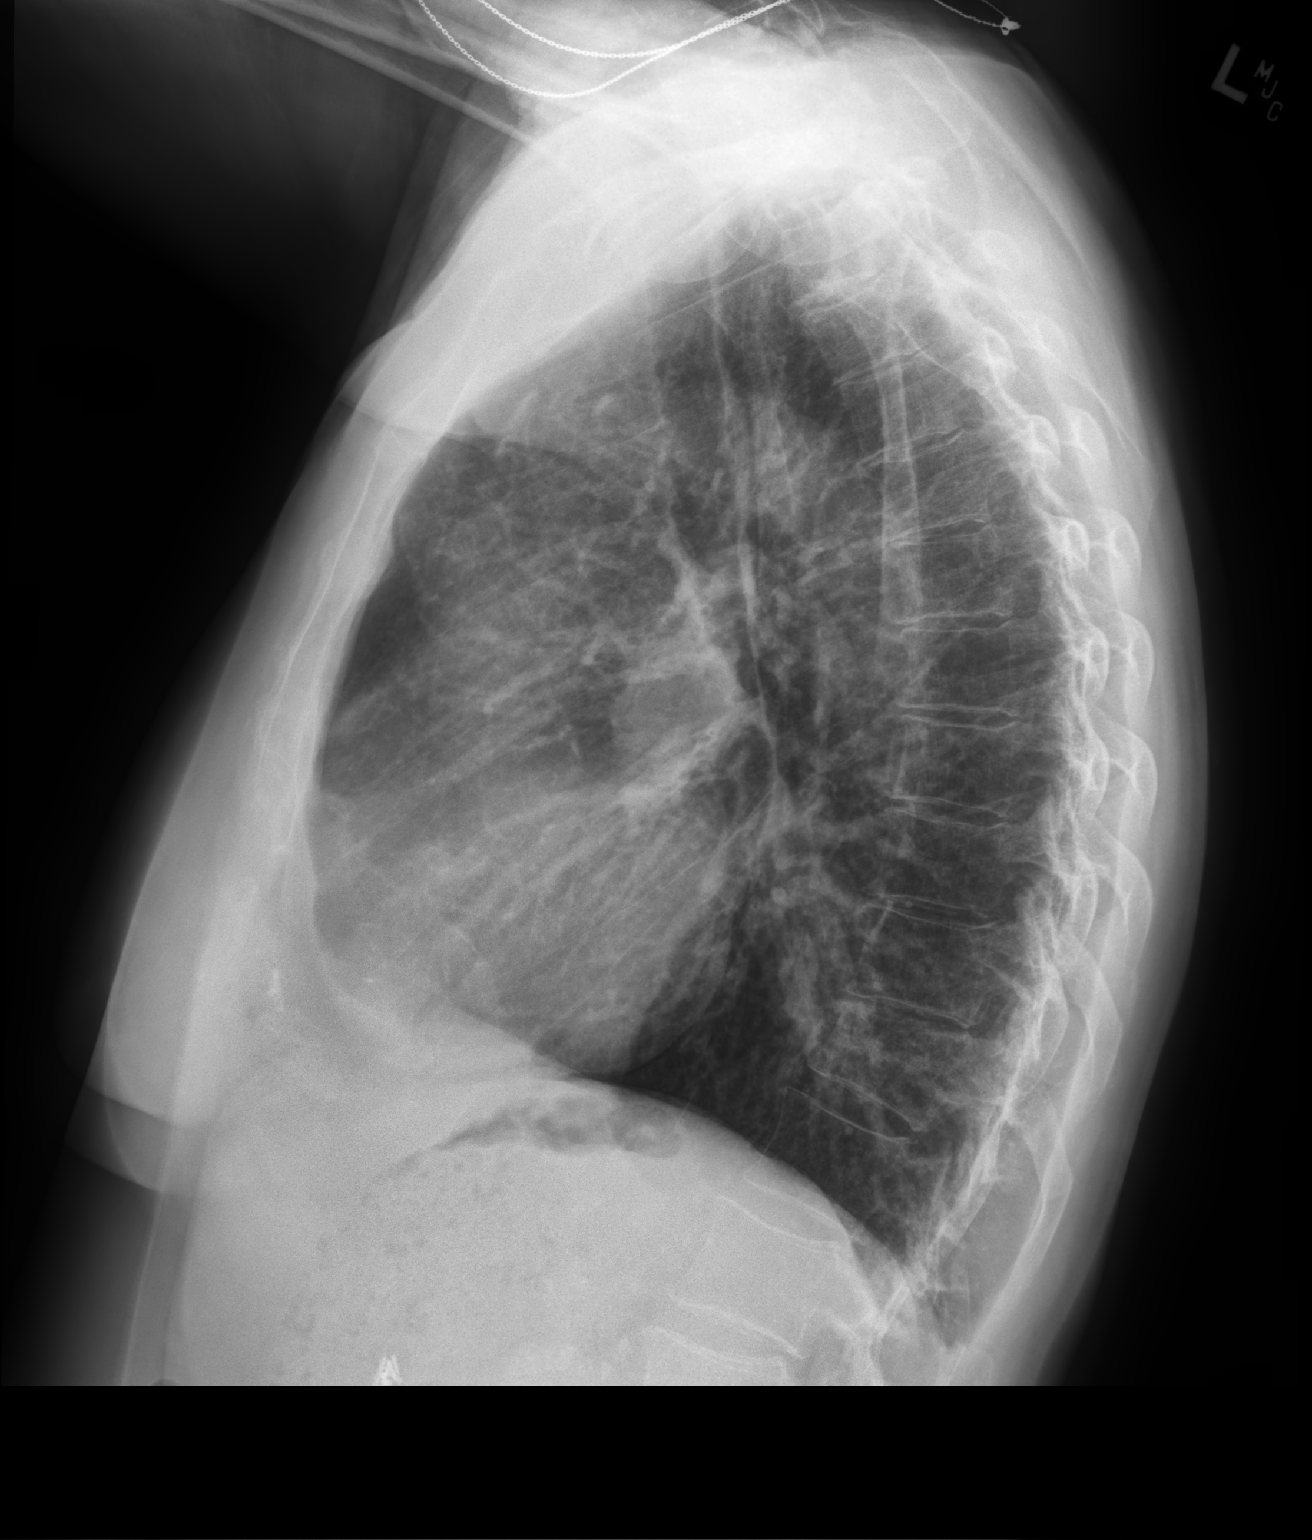

[2 of 2 positions shown; findings below may reference images not displayed]

FINDINGS: The heart size and mediastinal contours are within normal limits.
Both lungs are clear. No pneumothorax or pleural effusion is noted.
The visualized skeletal structures are unremarkable.
IMPRESSION: No active cardiopulmonary disease.
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
## 2023-05-24 ENCOUNTER — Telehealth: Payer: Self-pay

## 2023-05-24 NOTE — Patient Outreach (Signed)
 Aging Gracefully Program  05/24/2023  Devine Dant [birth year] [id number]   [facility] Evaluation Interviewer attempted to call patient on today regarding Aging Gracefully referral. No answer from patient after multiple rings. CMA left confidential voicemail for patient to return call.  Will attempt to call back within 1 week.    [facility]  Population Health Care Management Assistant  Direct Dial: [phone]  Fax: [phone] Website: [email or web address]

## 2023-05-30 ENCOUNTER — Telehealth: Payer: Self-pay

## 2023-05-30 NOTE — Patient Outreach (Signed)
 Aging Gracefully Program  05/30/2023  Ashley Juarez 1955-07-17 [id number]   [facility] Evaluation Interviewer made contact with patient. Aging Gracefully survey completed.   Interviewer will send referral to RN and OT for follow up.    [facility]  Population Health Care Management Assistant  Direct Dial: [phone]  Fax: [phone] Website: [email or web address]

## 2023-06-02 ENCOUNTER — Other Ambulatory Visit: Admitting: Occupational Therapy

## 2023-06-02 NOTE — Patient Outreach (Signed)
 Aging Gracefully Program  OT Initial Visit  06/02/2023  Ashley Juarez 12-13-1955 [id number]  Visit:  1- Initial Visit  Start Time:  1603 End Time:  1715 Total Minutes:  72  CCAP: Typical Daily Routine: Typical Daily Routine:: Goes to work 5 days a week What Types Of Care Problems Are You Having Throughout The Day?: Cannot step into and out of tub, cannot put her whole weight on one leg to step in with the other What Kind Of Help Do You Receive?: has a person through insurance she can use 30 hours per year to help her as needed Do You Think You Need Other Types Of Help?: someone to help with cleaning windowns glass doors; as well as get the stuff on her livingroom ceiling. (she thinks it is balls of cat hair) What Do You Think Would Make Everyday Life Easier For You?: a tub I can get into to shower What Is A Good Day Like?: less pain What Is A Bad Day Like?: more pain, when there is cold, wet weather      P  Functional Mobility-Walk A Block: Walk A Block: Moderate Difficulty Functional Mobility-Maintain Balance While Showering: Maintaining Balance While Showering:  (She is unsure due to she has not been able to get into her tub for some time due to she cannot step over into the tub by putting one leg in and bearing full weiight on it while she bringing other leg in). A walk in shower or  tub cut out if applicable would be recommended,  Readiness To Change Score:  Readiness to Change Score: 8.67  Home Environment Assessment: Outside Home Entry:: Lives on the 2nd floor condominium Bathroom:: Garden tub with shower, hand held shower. Standard height toilet  Goals:  Goals Addressed             This Visit's Progress    Patient Stated       She would like to feel safer and independent in showering. A tub to shower conversion or a tub cut out if applicable would help. Also a shower seat, 2 grab bars (one on long wall-horizontally and one on front wall-vertically) as well as a lower  handheld shower holder.        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 She works 5 days a week, drives, does her own errands and most of her housework.. She has 2 cats that she really enjoys. Client View Of His/Her Situation:: Ashley Juarez is happy in her home with the only thing that she would really like to have is a shower she can get into. Currently she she has a garden Neurosurgeon. Next Visit Plan:: How to get up from a fall and safety at home  [place], [place] Aging Gracefully [phone]

## 2023-06-14 ENCOUNTER — Other Ambulatory Visit: Payer: Self-pay | Admitting: *Deleted

## 2023-06-14 NOTE — Patient Outreach (Signed)
 Aging Gracefully Program  06/14/2023  Akiyah Eppolito 13-Jul-1955 [id number]   Telephone call made to Ms. Dahlen to schedule Aging Gracefully home RN visit #1. Ms. Hilliker reports she works during the day. Therefore needs a later appointment. Agreeable to home visit with AG RN for April 22nd at 4 pm.  Clinical research associate provided contact information.  Nolberto Batty, MSN, RN, BSN [place]  [facility], [facility] RN Case Manager for Aging Gracefully Direct Dial: [phone]

## 2023-06-20 ENCOUNTER — Encounter: Payer: Self-pay | Admitting: *Deleted

## 2023-06-20 ENCOUNTER — Other Ambulatory Visit: Payer: Self-pay | Admitting: *Deleted

## 2023-06-20 NOTE — Patient Outreach (Signed)
 Aging Gracefully Program  RN Visit  06/20/2023  Griselda Bramblett 30-Aug-1955 [id number]  Visit:  RN Visit Number: 1- Initial Visit  RN TIME CALCULATION: Start TIme:  RN Start Time Calculation: 1600 End Time:  RN Stop Time Calculation: 1740 Total Minutes:  RN Time Calculation: 100  Readiness To Change Score:  Readiness to Change Score: 4  Universal RN Interventions: Calendar Distribution: Yes Exercise Review: No Medications: Yes Medication Changes: Yes Pain: Yes PCP Advocacy/Support: No Fall Prevention: Yes Incontinence: Yes Clinician View Of Client Situation: Arrived for home visit. Ms. Rickerson was standing on the front porch waiting for writer while smoking. Has 3 cats in the home. Home smells of smoke. Ambulates independently without assistive device. Still works full time. Still drives. Client View Of His/Her Situation: Reports she recently was in the hospital in March. Has not followed up with any of her providers since hospitalization, despite previously scheduled appts by hospital staff.  Healthcare Provider Communication: Did Surveyor, mining With [employer] Provider?: No Healthcare Provider Response According to RN: N/A Healthcare Provider Response According To Client: N/a  Clinician View of Client Situation: Clinician View Of Client Situation: Arrived for home visit. Ms. Boschee was standing on the front porch waiting for writer while smoking. Has 3 cats in the home. Home smells of smoke. Ambulates independently without assistive device. Still works full time. Still drives. Client's View of His/Her Situation: Client View Of His/Her Situation: Reports she recently was in the hospital in March. Has not followed up with any of her providers since hospitalization, despite previously scheduled appts by hospital staff.  Medication Assessment: Do You Have Any Problems Paying For Medications?: No Where Does Client Store Medications?: Other: (KITCHEN CABINET) Can Client Read Pill  Bottles?: No Does Client Use A Pillbox?: No Does Anyone Assist Client In Taking Medications?: No Do You Take Vitamin D?: Yes Total Number Of Medications That The Client Takes: 11 Does Client Have Any Questions Or Concerns About Medictions?: No Is Client Complaining Of Any Symptoms That Could Be Side Effects To Medications?: Yes (YES BUT NOT SURE WHAT MEDICATION IT IS) Any Possible Changes In Medication Regimen?: No  OT Update: Pending [employer] assessments and contracts  Session Summary: Ms. Cashwell has no complaints or concerns at this time. Ms. Heinrichs pleasant and engaging during visit.    Goals Addressed             This Visit's Progress    AG RN         06/20/23   Assessment:  Ms. Nicholl reports having chronic arthritic pain. States surgery has been recommended but she doesn't plan on having surgery. Reports managing pain with Tylenol . 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 Still drives and lives independently at home. Reports being in the hospital in March. However, has not followed up outpatient with providers as recommended on hospital dc summary. Denies any falls. Continues to smoke 1.5 packs of cigarettes a day. States she has stopped smoking in the past. Does not plan on smoking cessation at this time. Report hair loss and brittle nails. Has 3 cats in the home that she enjoys. Has a son that lives in [place] and visits once a month to assist as needed.   Interventions: Encouraged Ms.Farrelly to schedule follow up PCP appointment sooner than the end of the year. Encouraged Ms. Diven to schedule pulmonology visit as recommended during last hospital visit. Encouraged Ms. Komorowski  to schedule appointments during her work breaks. Discussed going to appts after 3 pm when she gets off work. Provided calendar, chronic disease book, and writer's contact information.   Plan: Scheduled next home visit for May 22nd at 4 pm.    [name]  Registered Nurse:  Nolberto Batty Date: 06/20/2023  Client Name: [name] Client ID:    Target Area:  Aurther Blue   Why Problem May Occur: "In my mind, the hospital already fixed the problem." Busy work schedule   Target Goal: Schedule and attend doctors' appointments over the next 160 days.   STRATEGIES Coping Strategies: Ideas  Call to schedule eye doctor appt Schedule appointment during work breaks  Call to schedule PCP appt Keep track of appointments in calendars  Call to schedule cardiology appt Keep sticky notes and notes as reminders  Call schedule pulmonology appt Schedule later appointments in the day  Attend follow up appointments    Prevention Ideas                  PRACTICE It is important to practice the strategies so we can determine if they will be effective in helping to reach the goal.    Follow these specific recommendations:        If strategy does not work the first time, try it again.     We may make some changes over the next few sessions.       Nolberto Batty, MSN, RN, BSN [place]  [facility], Healthy Communities RN Case Manager for Aging Gracefully Direct Dial: [phone]                                      Nolberto Batty, MSN, RN, BSN [place]  [facility], Healthy Communities RN Case Manager for Aging Gracefully Direct Dial: [phone]

## 2023-06-20 NOTE — Patient Instructions (Signed)
 Visit Information  Thank you for taking time to visit with me today. Please don't hesitate to contact me if I can be of assistance to you before our next scheduled home appointment.  Following are the goals we discussed today:  (Copy and paste patient goals from clinical care plan here)  Our next appointment is on May 22nd at 4 pm.   If you are experiencing a Mental Health or Behavioral Health Crisis or need someone to talk to, please call the Suicide and Crisis Lifeline: 988 call the [place]  National Suicide Prevention Lifeline: 423-031-9025 or TTY: 701-884-9525 TTY 785-267-9316) to talk to a trained counselor call 1-800-273-TALK (toll free, 24 hour hotline) go to [facility] [address], [place] [phone]) call 911   The patient verbalized understanding of instructions, educational materials, and care plan provided today and agreed to receive a mailed copy of patient instructions, educational materials, and care plan.    Goals Addressed             This Visit's Progress    AG RN         06/20/23   Assessment:  Ashley Juarez reports having chronic arthritic pain. States surgery has been recommended but she doesn't plan on having surgery. Reports managing pain with Tylenol . 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 Still drives and lives independently at home. Reports being in the hospital in March. However, has not followed up outpatient with providers as recommended on hospital dc summary. Denies any falls. Continues to smoke 1.5 packs of cigarettes a day. States she has stopped smoking in the past. Does not plan on smoking cessation at this time. Report hair loss and brittle nails. Has 3 cats in the home that she enjoys. Has a son that lives in [place] and visits once a month to assist as needed.   Interventions: Encouraged Ashley Juarez to schedule follow up PCP appointment sooner than  the end of the year. Encouraged Ashley Juarez to schedule pulmonology visit as recommended during last hospital visit. Encouraged Ashley Juarez to schedule appointments during her work breaks. Discussed going to appts after 3 pm when she gets off work. Provided calendar, chronic disease book, and writer's contact information.   Plan: Scheduled next home visit for May 22nd at 4 pm.   [name]  Registered Nurse:  Nolberto Batty Date: 06/20/2023  Client Name: Ashley Juarez Client ID:    Target Area:  Ashley Juarez   Why Problem May Occur: "In my mind, the hospital already fixed the problem." Busy work schedule   Target Goal: Schedule and attend doctors' appointments over the next 160 days.   STRATEGIES Coping Strategies: Ideas  Call to schedule eye doctor appt Schedule appointment during work breaks  Call to schedule PCP appt Keep track of appointments in calendars  Call to schedule cardiology appt Keep sticky notes and notes as reminders  Call schedule pulmonology appt Schedule later appointments in the day  Attend follow up appointments    Prevention Ideas                  PRACTICE It is important to practice the strategies so we can determine if they will be effective in helping to reach the goal.    Follow these specific recommendations:        If strategy does not work the first time, try it again.  We may make some changes over the next few sessions.       Nolberto Batty, MSN, RN, BSN [place]  [facility], Healthy Communities RN Case Manager for Aging Gracefully Direct Dial: [phone]

## 2023-07-12 ENCOUNTER — Other Ambulatory Visit: Payer: Self-pay | Admitting: Occupational Therapy

## 2023-07-12 NOTE — Patient Outreach (Signed)
 Aging Gracefully Program  OT Follow-Up Visit  07/12/2023  Cheryce Grandstaff Nov 12, 1955 [id number]  Visit:  2- Second Visit  Start Time:  1600 End Time:  1645 Total Minutes:  45  Patient Education: Education Provided: Yes Education Details: "How to get up from a fall" and "safety checklist at home" handouts Person(s) Educated: Patient Comprehension: Verbalized Understanding  Goals: Today's focus was on education handouts.    Post Clinical Reasoning: Clinician View Of Client Situation:: Ms. Gangloff is continuing in her routines as when we first met. Client View Of His/Her Situation:: Ms. Tatlock reports she is getting around fairly well, she had her knee braces off today due to cutting into her skin on the back of her leg. She is looking forward to getting her walk in shower. Next Visit Plan:: Check out tub to shower conversion and check with her about a shower seat at that time (I showed her a picture of one today)  Caryl Clas, OT Aging Gracefully [phone]

## 2023-07-20 ENCOUNTER — Encounter: Payer: Self-pay | Admitting: *Deleted

## 2023-07-20 ENCOUNTER — Other Ambulatory Visit: Payer: Self-pay | Admitting: *Deleted

## 2023-07-20 NOTE — Patient Outreach (Signed)
 Aging Gracefully Program  RN Visit  07/20/2023  Ashley Juarez 06-28-1955 [id number]  Visit:  RN Visit Number: 2- Second Visit  RN TIME CALCULATION: Start TIme:  RN Start Time Calculation: 1600 End Time:  RN Stop Time Calculation: 1634 Total Minutes:  RN Time Calculation: 34  Readiness To Change Score:  Readiness to Change Score: 5.67  Universal RN Interventions: Calendar Distribution: No Exercise Review: Yes Medications: Yes Medication Changes: No Mood: Yes Pain: Yes PCP Advocacy/Support: No Fall Prevention: Yes Incontinence: Yes Clinician View Of Client Situation: Arrived for home visit. Ashley Juarez was on the phone with son upon arrival. Ambulating independently without assistive device. Cats present. Home smells of smoke. Client View Of His/Her Situation: Ashley Juarez express some concern about her son. Overall doing well. Still works and walks daily during her breaks.  Healthcare Provider Communication: Did Surveyor, mining With [employer] Provider?: No Healthcare Provider Response According to RN: n/a According to Client, Did PCP Report Communication With An Aging Gracefully RN?: No Healthcare Provider Response According To Client: n/a  Clinician View of Client Situation: Clinician View Of Client Situation: Arrived for home visit. Ashley Juarez was on the phone with son upon arrival. Ambulating independently without assistive device. Cats present. Home smells of smoke. Client's View of His/Her Situation: Client View Of His/Her Situation: Ashley Juarez express some concern about her son. Overall doing well. Still works and walks daily during her breaks.  Medication Assessment: Reviewed    OT Update: Pending [facility] contracts and assessments.   Session Summary: [name] reports having chronic nausea after taking medications. Took antiemetic prior to home visit.    Goals Addressed             This Visit's Progress    AG RN         06/20/23   Assessment:  Ashley Juarez reports having  chronic arthritic pain. States surgery has been recommended but she doesn't plan on having surgery. Reports managing pain with Tylenol . 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 Still drives and lives independently at home. Reports being in the hospital in March. However, has not followed up outpatient with providers as recommended on hospital dc summary. Denies any falls. Continues to smoke 1.5 packs of cigarettes a day. States she has stopped smoking in the past. Does not plan on smoking cessation at this time. Report hair loss and brittle nails. Has 3 cats in the home that she enjoys. Has a son that lives in [place] and visits once a month to assist as needed.   Interventions: Encouraged AshleyJuarez to schedule follow up PCP appointment sooner than the end of the year. Encouraged Ashley Juarez to schedule pulmonology visit as recommended during last hospital visit. Encouraged Ashley Juarez to schedule appointments during her work breaks. Discussed going to appts after 3 pm when she gets off work. Provided calendar, chronic disease book, and writer's contact information.   Plan: Scheduled next home visit for May 22nd at 4 pm.   [name]  Registered Nurse:  Nolberto Batty Date: 06/20/2023  Client Name: Ashley Juarez Client ID:    Target Area:  Aurther Blue   Why Problem May Occur: "In my mind, the hospital already fixed the problem." Busy work schedule   Target Goal: Schedule and attend doctors' appointments over the next 160 days.   STRATEGIES Coping Strategies: Ideas  Call to schedule eye doctor appt  Schedule appointment during work breaks  Call to schedule PCP appt Keep track of appointments in calendars  Call to schedule cardiology appt Keep sticky notes and notes as reminders  Call schedule pulmonology appt Schedule later appointments in the day  Attend follow up appointments    Prevention Ideas                   PRACTICE It is important to practice the strategies so we can determine if they will be effective in helping to reach the goal.    Follow these specific recommendations:        If strategy does not work the first time, try it again.     We may make some changes over the next few sessions.       Nolberto Batty, MSN, RN, BSN [facility], Healthy Communities RN Case Manager for Aging Gracefully Direct Dial: [phone]     07/20/23  Assessment: Ashley Juarez works daily. States she walks during her lunch breaks downtown. Continues to smoke. Reports chronic arthritic pain. States she plans to call to schedule eye appt at the end of June. States she plans to go to PCP in Oct. Cardiologist appt scheduled in June as well. Reports having chronic nausea after taking her medications. Takes meds on empty stomach. Reports having nausea during home visit.   Interventions: Demonstrated Aging Gracefully home exercises. Provided home exercises booklet. Encouraged Ashley Juarez to eat a cracker or small snack or meal before taking medications.   Plan: Scheduled next home visit for June 30th at 4 pm.   Nolberto Batty, MSN, RN, BSN [place]  [facility], Healthy Communities RN Case Manager for Aging Gracefully Direct Dial: [phone]                                                              Nolberto Batty, MSN, RN, BSN [place]  [facility], Healthy Communities RN Case Manager for Aging Gracefully Direct Dial: [phone]

## 2023-07-20 NOTE — Patient Instructions (Signed)
 Visit Information  Thank you for taking time to visit with me today. Please don't hesitate to contact me if I can be of assistance to you before our next scheduled home appointment.  Following are the goals we discussed today:   Goals Addressed             This Visit's Progress    AG RN         06/20/23   Assessment:  Ashley Juarez reports having chronic arthritic pain. States surgery has been recommended but she doesn't plan on having surgery. Reports managing pain with Tylenol . 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 Still drives and lives independently at home. Reports being in the hospital in March. However, has not followed up outpatient with providers as recommended on hospital dc summary. Denies any falls. Continues to smoke 1.5 packs of cigarettes a day. States she has stopped smoking in the past. Does not plan on smoking cessation at this time. Report hair loss and brittle nails. Has 3 cats in the home that she enjoys. Has a son that lives in [place] and visits once a month to assist as needed.   Interventions: Encouraged Ashley Juarez to schedule follow up PCP appointment sooner than the end of the year. Encouraged Ashley Juarez to schedule pulmonology visit as recommended during last hospital visit. Encouraged Ashley Juarez to schedule appointments during her work breaks. Discussed going to appts after 3 pm when she gets off work. Provided calendar, chronic disease book, and writer's contact information.   Plan: Scheduled next home visit for May 22nd at 4 pm.   [name]  Registered Nurse:  Nolberto Batty Date: 06/20/2023  Client Name: Ashley Juarez Client ID:    Target Area:  Ashley Juarez   Why Problem May Occur: "In my mind, the hospital already fixed the problem." Busy work schedule   Target Goal: Schedule and attend doctors' appointments over the next 160 days.   STRATEGIES Coping  Strategies: Ideas  Call to schedule eye doctor appt Schedule appointment during work breaks  Call to schedule PCP appt Keep track of appointments in calendars  Call to schedule cardiology appt Keep sticky notes and notes as reminders  Call schedule pulmonology appt Schedule later appointments in the day  Attend follow up appointments    Prevention Ideas                  PRACTICE It is important to practice the strategies so we can determine if they will be effective in helping to reach the goal.    Follow these specific recommendations:        If strategy does not work the first time, try it again.     We may make some changes over the next few sessions.       Nolberto Batty, MSN, RN, BSN [facility], Healthy Communities RN Case Manager for Aging Gracefully Direct Dial: [phone]     07/20/23  Assessment: Ashley Juarez works daily. States she walks during her lunch breaks downtown. Continues to smoke. Reports chronic arthritic pain. States she plans to call to schedule eye appt at the end of June. States she plans to go to PCP in Oct. Cardiologist appt scheduled in June as well. Reports having chronic nausea after taking her medications. Takes meds on empty stomach. Reports having nausea during home visit.   Interventions: Demonstrated Aging  Gracefully home exercises. Provided home exercises booklet. Encouraged Ashley Juarez to eat a cracker or small snack or meal before taking medications.   Plan: Scheduled next home visit for June 30th at 4 pm.   Nolberto Batty, MSN, RN, BSN [place]  [facility], Healthy Communities RN Case Manager for Aging Gracefully Direct Dial: [phone]                                                                 Our next appointment is on June 30th at 4pm.  If you are experiencing a Mental Health or Behavioral Health Crisis or need someone to talk to, please  call the Suicide and Crisis Lifeline: 988 call the [place]  National Suicide Prevention Lifeline: 206-701-9667 or TTY: (567) 568-8415 TTY (640) 247-9703) to talk to a trained counselor call 1-800-273-TALK (toll free, 24 hour hotline) go to [facility] [address], [place] [phone]) call 911   The patient verbalized understanding of instructions, educational materials, and care plan provided today and agreed to receive a mailed copy of patient instructions, educational materials, and care plan.   Nolberto Batty, MSN, RN, BSN [place]  [facility], Healthy Communities RN Case Manager for Aging Gracefully Direct Dial: [phone]

## 2023-08-28 ENCOUNTER — Other Ambulatory Visit: Payer: Self-pay | Admitting: *Deleted

## 2023-08-28 ENCOUNTER — Encounter: Payer: Self-pay | Admitting: *Deleted

## 2023-08-29 ENCOUNTER — Encounter: Payer: Self-pay | Admitting: *Deleted

## 2023-08-29 NOTE — Patient Outreach (Signed)
 Aging Gracefully Program  RN Visit  08/29/2023  Ashley Juarez 1955-11-07 [id number]  Visit:  RN Visit Number: 3- Third Visit  RN TIME CALCULATION: Start TIme:  RN Start Time Calculation: 1600 End Time:  RN Stop Time Calculation: 1640 Total Minutes:  RN Time Calculation: 40  Readiness To Change Score:  Readiness to Change Score: 5.67  Universal RN Interventions: Calendar Distribution: No Exercise Review: Yes Medications: Yes Medication Changes: Yes Mood: Yes Pain: Yes PCP Advocacy/Support: No Fall Prevention: Yes Incontinence: Yes Clinician View Of Client Situation: Arrived for home visit. Went to back door due to front steps being repaired by HOA. Living room area and kitchen neat and without clutter. Two cats presents. Ashley Juarez ambulates independently without assistive device. Left knee brace noted. Client View Of His/Her Situation: Ashley Juarez reports she is looking forward for home modifications to begin. No start date yet. Denies any pain. Reports she is doing well overall  Healthcare Provider Communication: Did Surveyor, mining With [employer] Provider?: No Healthcare Provider Response According to RN: N/A According to Client, Did PCP Report Communication With An Aging Gracefully RN?: No Healthcare Provider Response According To Client: N/A  Clinician View of Client Situation: Clinician View Of Client Situation: Arrived for home visit. Went to back door due to front steps being repaired by HOA. Living room area and kitchen neat and without clutter. Two cats presents. Ashley Juarez ambulates independently without assistive device. Left knee brace noted. Client's View of His/Her Situation: Client View Of His/Her Situation: Ashley Juarez reports she is looking forward for home modifications to begin. No start date yet. Denies any pain. Reports she is doing well overall  Medication Assessment: Reviewed    OT Update: Pending CHS modifications  Session Summary: Ashley Juarez reports she is doing  well overall. She is all smiles and happy that her son is doing well. She is looking forward to his grand opening of the tattoo shop this Saturday.    Goals Addressed             This Visit's Progress    AG RN         06/20/23   Assessment:  Ashley Juarez reports having chronic arthritic pain. States surgery has been recommended but she doesn't plan on having surgery. Reports managing pain with Tylenol . 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 Still drives and lives independently at home. Reports being in the hospital in March. However, has not followed up outpatient with providers as recommended on hospital dc summary. Denies any falls. Continues to smoke 1.5 packs of cigarettes a day. States she has stopped smoking in the past. Does not plan on smoking cessation at this time. Report hair loss and brittle nails. Has 3 cats in the home that she enjoys. Has a son that lives in [place] and visits once a month to assist as needed.   Interventions: Encouraged AshleyJuarez to schedule follow up PCP appointment sooner than the end of the year. Encouraged Ashley Juarez to schedule pulmonology visit as recommended during last hospital visit. Encouraged Ashley Juarez to schedule appointments during her work breaks. Discussed going to appts after 3 pm when she gets off work. Provided calendar, chronic disease book, and writer's contact information.   Plan: Scheduled next home visit for May 22nd at 4 pm.   [name]  Registered Nurse:  Pablo Hurst Date: 06/20/2023  Client Name:  Ashley Juarez Client ID:    Target Area:  SOFIA   Why Problem May Occur: In my mind, the hospital already fixed the problem. Busy work schedule   Target Goal: Schedule and attend doctors' appointments over the next 160 days.   STRATEGIES Coping Strategies: Ideas  Call to schedule eye doctor appt Schedule appointment during work  breaks  Call to schedule PCP appt Keep track of appointments in calendars  Call to schedule cardiology appt Keep sticky notes and notes as reminders  Call schedule pulmonology appt Schedule later appointments in the day  Attend follow up appointments    Prevention Ideas                  PRACTICE It is important to practice the strategies so we can determine if they will be effective in helping to reach the goal.    Follow these specific recommendations:        If strategy does not work the first time, try it again.     We may make some changes over the next few sessions.       Pablo Hurst, MSN, RN, BSN [facility], Healthy Communities RN Case Manager for Aging Gracefully Direct Dial: [phone]     07/20/23  Assessment: Ashley Juarez works daily. States she walks during her lunch breaks downtown. Continues to smoke. Reports chronic arthritic pain. States she plans to call to schedule eye appt at the end of June. States she plans to go to PCP in Oct. Cardiologist appt scheduled in June as well. Reports having chronic nausea after taking her medications. Takes meds on empty stomach. Reports having nausea during home visit.   Interventions: Demonstrated Aging Gracefully home exercises. Provided home exercises booklet. Encouraged Ashley Juarez to eat a cracker or small snack or meal before taking medications.   Plan: Scheduled next home visit for June 30th at 4 pm.   Pablo Hurst, MSN, RN, BSN [facility], Healthy Communities RN Case Manager for Aging Gracefully Direct Dial: [phone]    08/29/23  Assessment: Ashley Juarez reports going to her cardiology appointment in early June. States appointment went well. Reports hair still thinning and she has tremors. States she has been told it could be from her medications. States PCP appointment is in October. Reports she is happy that her son has recently become owner of the tattoo shop he  has been working at. States she is happy that he is happy and her stress levels have lessened because of it. Reports pain 0 out of 10. Relates her increased pain has been because of increase stress levels.   Interventions. Encouraged Ashley Juarez to make schedule an appointment with PCP to discuss tremors, hair thinning ,medications, and thyroid panel. Encouraged Ashley Juarez to speak with pharmacist at her pharmacy to inquire about potential interactions or side effects of medications. Discussed importance of PCP follow up for concerns and medication changes.   Plan: Scheduled next home visit for July 31st at 4 pm.    Pablo Hurst, MSN, RN, BSN [place]  [facility], Healthy Communities RN Case Manager for Aging Gracefully Direct Dial: [phone]

## 2023-08-29 NOTE — Patient Instructions (Signed)
 Visit Information  Thank you for taking time to visit with me today. Please don't hesitate to contact me if I can be of assistance to you before our next scheduled home appointment.  Following are the goals we discussed today:   Goals Addressed             This Visit's Progress    AG RN         06/20/23   Assessment:  Ms. Deanda reports having chronic arthritic pain. States surgery has been recommended but she doesn't plan on having surgery. Reports managing pain with Tylenol . 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 Still drives and lives independently at home. Reports being in the hospital in March. However, has not followed up outpatient with providers as recommended on hospital dc summary. Denies any falls. Continues to smoke 1.5 packs of cigarettes a day. States she has stopped smoking in the past. Does not plan on smoking cessation at this time. Report hair loss and brittle nails. Has 3 cats in the home that she enjoys. Has a son that lives in [place] and visits once a month to assist as needed.   Interventions: Encouraged Ms.Babich to schedule follow up PCP appointment sooner than the end of the year. Encouraged Ms. Fryberger to schedule pulmonology visit as recommended during last hospital visit. Encouraged Ms. Steger to schedule appointments during her work breaks. Discussed going to appts after 3 pm when she gets off work. Provided calendar, chronic disease book, and writer's contact information.   Plan: Scheduled next home visit for May 22nd at 4 pm.   [name]  Registered Nurse:  Pablo Hurst Date: 06/20/2023  Client Name: Elvie Zada Client ID:    Target Area:  SOFIA   Why Problem May Occur: In my mind, the hospital already fixed the problem. Busy work schedule   Target Goal: Schedule and attend doctors' appointments over the next 160 days.   STRATEGIES Coping  Strategies: Ideas  Call to schedule eye doctor appt Schedule appointment during work breaks  Call to schedule PCP appt Keep track of appointments in calendars  Call to schedule cardiology appt Keep sticky notes and notes as reminders  Call schedule pulmonology appt Schedule later appointments in the day  Attend follow up appointments    Prevention Ideas                  PRACTICE It is important to practice the strategies so we can determine if they will be effective in helping to reach the goal.    Follow these specific recommendations:        If strategy does not work the first time, try it again.     We may make some changes over the next few sessions.       Pablo Hurst, MSN, RN, BSN [facility], Healthy Communities RN Case Manager for Aging Gracefully Direct Dial: [phone]     07/20/23  Assessment: Ms. Blouch works daily. States she walks during her lunch breaks downtown. Continues to smoke. Reports chronic arthritic pain. States she plans to call to schedule eye appt at the end of June. States she plans to go to PCP in Oct. Cardiologist appt scheduled in June as well. Reports having chronic nausea after taking her medications. Takes meds on empty stomach. Reports having nausea during home visit.   Interventions: Demonstrated Aging  Gracefully home exercises. Provided home exercises booklet. Encouraged Ms. Bonneau to eat a cracker or small snack or meal before taking medications.   Plan: Scheduled next home visit for June 30th at 4 pm.   Pablo Hurst, MSN, RN, BSN [facility], Healthy Communities RN Case Manager for Aging Gracefully Direct Dial: [phone]    08/29/23  Assessment: Ms. Cerullo reports going to her cardiology appointment in early June. States appointment went well. Reports hair still thinning and she has tremors. States she has been told it could be from her medications. States PCP appointment is in  October. Reports she is happy that her son has recently become owner of the tattoo shop he has been working at. States she is happy that he is happy and her stress levels have lessened because of it. Reports pain 0 out of 10. Relates her increased pain has been because of increase stress levels.   Interventions. Encouraged Ms. Miah to make schedule an appointment with PCP to discuss tremors, hair thinning ,medications, and thyroid panel. Encouraged Ms. Tews to speak with pharmacist at her pharmacy to inquire about potential interactions or side effects of medications. Discussed importance of PCP follow up for concerns and medication changes.   Plan: Scheduled next home visit for July 31st at 4 pm.    Pablo Hurst, MSN, RN, BSN [place]  [facility], Healthy Communities RN Case Manager for Aging Gracefully Direct Dial: [phone]                                                                                      Our next appointment is on July 31 at 4pm.  If you are experiencing a Mental Health or Behavioral Health Crisis or need someone to talk to, please call the Suicide and Crisis Lifeline: 988 call the [place]  National Suicide Prevention Lifeline: 903-057-5273 or TTY: (202) 882-0482 TTY 2724171036) to talk to a trained counselor call 1-800-273-TALK (toll free, 24 hour hotline) go to [facility] [address] [place] [phone]) call 911   The patient verbalized understanding of instructions, educational materials, and care plan provided today and agreed to receive a mailed copy of patient instructions, educational materials, and care plan.   Pablo Hurst, MSN, RN, BSN [place]  [facility], Healthy Communities RN Case Manager for Aging Gracefully Direct Dial: [phone]

## 2023-09-27 ENCOUNTER — Encounter: Admitting: *Deleted

## 2023-09-28 ENCOUNTER — Other Ambulatory Visit: Payer: Self-pay | Admitting: *Deleted

## 2023-09-29 ENCOUNTER — Encounter: Payer: Self-pay | Admitting: *Deleted

## 2023-09-29 NOTE — Patient Outreach (Signed)
 Aging Gracefully Program  RN Visit  09/29/2023  Ashley Juarez 05-27-1955 [id number]  Visit:  RN Visit Number: 4- Fourth Visit  RN TIME CALCULATION: Start TIme:  RN Start Time Calculation: 1600 End Time:  RN Stop Time Calculation: 1700 Total Minutes:  RN Time Calculation: 60  Readiness To Change Score:  Readiness to Change Score: 5.67  Universal RN Interventions: Calendar Distribution: No Exercise Review: Yes Medications: Yes Medication Changes: No Mood: Yes Pain: Yes PCP Advocacy/Support: No Fall Prevention: Yes Incontinence: Yes Clinician View Of Client Situation: Arrived for home visit.  Ashley Juarez answered door. Slight limp with left knee brace on. Ambulates independently without assistive device. 3 cats present. Client View Of His/Her Situation: Ashley Juarez is ecstatic that her walk-in shower has been completed by [facility].  Healthcare Provider Communication: Did Surveyor, mining With [employer] Provider?: No Healthcare Provider Response According to RN: n/a According to Client, Did PCP Report Communication With An Aging Gracefully RN?: No Healthcare Provider Response According To Client: n/a  Clinician View of Client Situation: Clinician View Of Client Situation: Arrived for home visit.  Ashley Juarez answered door. Slight limp with left knee brace on. Ambulates independently without assistive device. 3 cats present. Client's View of His/Her Situation: Client View Of His/Her Situation: Ashley Juarez is ecstatic that her walk-in shower has been completed by [facility].  Medication Assessment: reviewed    OT Update: pending scheduled visit  Session Summary: Ashley Juarez is delighted with her new walk-in shower. [place] and excitement shows in her bright smile and demeanor.   Goals Addressed               This Visit's Progress     COMPLETED: AG RN (pt-stated)          06/20/23   Assessment:  Ashley Juarez reports having chronic arthritic pain. States surgery has been recommended but she doesn't  plan on having surgery. Reports managing pain with Tylenol . 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 Still drives and lives independently at home. Reports being in the hospital in March. However, has not followed up outpatient with providers as recommended on hospital dc summary. Denies any falls. Continues to smoke 1.5 packs of cigarettes a day. States she has stopped smoking in the past. Does not plan on smoking cessation at this time. Report hair loss and brittle nails. Has 3 cats in the home that she enjoys. Has a son that lives in [place] and visits once a month to assist as needed.   Interventions: Encouraged AshleyJuarez to schedule follow up PCP appointment sooner than the end of the year. Encouraged Ashley Juarez to schedule pulmonology visit as recommended during last hospital visit. Encouraged Ashley Juarez to schedule appointments during her work breaks. Discussed going to appts after 3 pm when she gets off work. Provided calendar, chronic disease book, and writer's contact information.   Plan: Scheduled next home visit for May 22nd at 4 pm.   [name]  Registered Nurse:  Pablo Hurst Date: 06/20/2023  Client Name: Ashley Juarez Client ID:    Target Area:  SOFIA   Why Problem May Occur: In my mind, the hospital already fixed the problem. Busy work schedule   Target Goal: Schedule and attend doctors' appointments over the next 160 days.   STRATEGIES Coping Strategies: Ideas  Call to schedule eye doctor appt Schedule appointment during work breaks  Call  to schedule PCP appt Keep track of appointments in calendars  Call to schedule cardiology appt Keep sticky notes and notes as reminders  Call schedule pulmonology appt Schedule later appointments in the day  Attend follow up appointments    Prevention Ideas                  PRACTICE It is important to practice the strategies so  we can determine if they will be effective in helping to reach the goal.    Follow these specific recommendations:        If strategy does not work the first time, try it again.     We may make some changes over the next few sessions.       Pablo Hurst, MSN, RN, BSN [facility], Healthy Communities RN Case Manager for Aging Gracefully Direct Dial: [phone]     07/20/23  Assessment: Ashley Juarez works daily. States she walks during her lunch breaks downtown. Continues to smoke. Reports chronic arthritic pain. States she plans to call to schedule eye appt at the end of June. States she plans to go to PCP in Oct. Cardiologist appt scheduled in June as well. Reports having chronic nausea after taking her medications. Takes meds on empty stomach. Reports having nausea during home visit.   Interventions: Demonstrated Aging Gracefully home exercises. Provided home exercises booklet. Encouraged Ashley Juarez to eat a cracker or small snack or meal before taking medications.   Plan: Scheduled next home visit for June 30th at 4 pm.   Pablo Hurst, MSN, RN, BSN [facility], Healthy Communities RN Case Manager for Aging Gracefully Direct Dial: [phone]    08/29/23  Assessment: Ashley Juarez reports going to her cardiology appointment in early June. States appointment went well. Reports hair still thinning and she has tremors. States she has been told it could be from her medications. States PCP appointment is in October. Reports she is happy that her son has recently become owner of the tattoo shop he has been working at. States she is happy that he is happy and her stress levels have lessened because of it. Reports pain 0 out of 10. Relates her increased pain has been because of increase stress levels.   Interventions. Encouraged Ashley Juarez to make schedule an appointment with PCP to discuss tremors, hair thinning ,medications, and thyroid panel.  Encouraged Ashley Juarez to speak with pharmacist at her pharmacy to inquire about potential interactions or side effects of medications. Discussed importance of PCP follow up for concerns and medication changes.   Plan: Scheduled next home visit for July 31st at 4 pm.    Pablo Hurst, MSN, RN, BSN [place]  [facility], Healthy Communities RN Case Manager for Aging Gracefully Direct Dial: [phone]    09/28/23  Assessment: Ms. Limberg reports recently going to urgent care for irritation, inflamed, painful area between her toes on her left foot. States she was dx with athletes foot and was prescribed cream. States she wears flip flops because regular shoes hurt. States she has upcoming appointments with her PCP and cardiologist in Oct and Dec that she plans to attend. States she has not called doctor about tremors. However tremors appear to be less today. Ms. Yodice states she is happy with her new walk-in shower. States she is ecstatic because she no longer has to go to the nearby gym to take showers.   Interventions: Discussed fall precautions. Encouraged Ms.  [place] to stay hydrated and drink plenty of water during these hot days. Celebrated with Ms. Stirling in her excitement about CHS recent modifications. Discussed this is writer's last home visit. Discussed OT will visit since work is now completed.  Plan: Will update AG team of writer's last visit.

## 2023-09-29 NOTE — Patient Instructions (Signed)
 Visit Information  Thank you for taking time to visit with me today. Please don't hesitate to contact me if I can be of assistance to you before our next scheduled home appointment.  Following are the goals we discussed today:   Goals Addressed               This Visit's Progress     COMPLETED: AG RN (pt-stated)          06/20/23   Assessment:  Ms. Ashley Juarez reports having chronic arthritic pain. States surgery has been recommended but she doesn't plan on having surgery. Reports managing pain with Tylenol . 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 Still drives and lives independently at home. Reports being in the hospital in March. However, has not followed up outpatient with providers as recommended on hospital dc summary. Denies any falls. Continues to smoke 1.5 packs of cigarettes a day. States she has stopped smoking in the past. Does not plan on smoking cessation at this time. Report hair loss and brittle nails. Has 3 cats in the home that she enjoys. Has a son that lives in [place] and visits once a month to assist as needed.   Interventions: Encouraged Ms.Ashley Juarez to schedule follow up PCP appointment sooner than the end of the year. Encouraged Ms. Ashley Juarez to schedule pulmonology visit as recommended during last hospital visit. Encouraged Ms. Ashley Juarez to schedule appointments during her work breaks. Discussed going to appts after 3 pm when she gets off work. Provided calendar, chronic disease book, and writer's contact information.   Plan: Scheduled next home visit for May 22nd at 4 pm.   [name]  Registered Nurse:  Pablo Hurst Date: 06/20/2023  Client Name: Ashley Juarez Client ID:    Target Area:  SOFIA   Why Problem May Occur: In my mind, the hospital already fixed the problem. Busy work schedule   Target Goal: Schedule and attend doctors' appointments over the next 160 days.    STRATEGIES Coping Strategies: Ideas  Call to schedule eye doctor appt Schedule appointment during work breaks  Call to schedule PCP appt Keep track of appointments in calendars  Call to schedule cardiology appt Keep sticky notes and notes as reminders  Call schedule pulmonology appt Schedule later appointments in the day  Attend follow up appointments    Prevention Ideas                  PRACTICE It is important to practice the strategies so we can determine if they will be effective in helping to reach the goal.    Follow these specific recommendations:        If strategy does not work the first time, try it again.     We may make some changes over the next few sessions.       Pablo Hurst, MSN, RN, BSN [facility], Healthy Communities RN Case Manager for Aging Gracefully Direct Dial: [phone]     07/20/23  Assessment: Ms. Ashley Juarez works daily. States she walks during her lunch breaks downtown. Continues to smoke. Reports chronic arthritic pain. States she plans to call to schedule eye appt at the end of June. States she plans to go to PCP in Oct. Cardiologist appt scheduled in June as well. Reports having chronic nausea after taking her medications. Takes meds on empty stomach. Reports having nausea during home  visit.   Interventions: Demonstrated Aging Gracefully home exercises. Provided home exercises booklet. Encouraged Ms. Ashley Juarez to eat a cracker or small snack or meal before taking medications.   Plan: Scheduled next home visit for June 30th at 4 pm.   Pablo Hurst, MSN, RN, BSN [facility], Healthy Communities RN Case Manager for Aging Gracefully Direct Dial: [phone]    08/29/23  Assessment: Ms. Ashley Juarez reports going to her cardiology appointment in early June. States appointment went well. Reports hair still thinning and she has tremors. States she has been told it could be from her medications. States PCP  appointment is in October. Reports she is happy that her son has recently become owner of the tattoo shop he has been working at. States she is happy that he is happy and her stress levels have lessened because of it. Reports pain 0 out of 10. Relates her increased pain has been because of increase stress levels.   Interventions. Encouraged Ms. Ashley Juarez to make schedule an appointment with PCP to discuss tremors, hair thinning ,medications, and thyroid panel. Encouraged Ms. Ashley Juarez to speak with pharmacist at her pharmacy to inquire about potential interactions or side effects of medications. Discussed importance of PCP follow up for concerns and medication changes.   Plan: Scheduled next home visit for July 31st at 4 pm.    Pablo Hurst, MSN, RN, BSN [place]  [facility], Healthy Communities RN Case Manager for Aging Gracefully Direct Dial: [phone]    09/28/23  Assessment: Ms. Ashley Juarez reports recently going to urgent care for irritation, inflamed, painful area between her toes on her left foot. States she was dx with athletes foot and was prescribed cream. States she wears flip flops because regular shoes hurt. States she has upcoming appointments with her PCP and cardiologist in Oct and Dec that she plans to attend. States she has not called doctor about tremors. However tremors appear to be less today. Ms. Ashley Juarez states she is happy with her new walk-in shower. States she is ecstatic because she no longer has to go to the nearby gym to take showers.   Interventions: Discussed fall precautions. Encouraged Ms. Ashley Juarez to stay hydrated and drink plenty of water during these hot days. Celebrated with Ms. Ashley Juarez in her excitement about [facility] recent modifications. Discussed this is writer's last home visit. Discussed OT will visit since work is now completed.  Plan: Will update AG team of writer's last visit.                                                                                If you are experiencing a Mental Health or Behavioral Health Crisis or need someone to talk to, please call the Suicide and Crisis Lifeline: 988 call the [place]  National Suicide Prevention Lifeline: (339)485-4203 or TTY: 415-484-0480 TTY (814)494-3329) to talk to a trained counselor call 1-800-273-TALK (toll free, 24 hour hotline) go to [facility] [address] [place] [phone]) call 911   The patient verbalized understanding of instructions, educational materials, and care plan provided today and agreed to receive a mailed copy of patient instructions, educational materials, and care plan.  Pablo Hurst, MSN, RN, BSN [facility], Healthy Communities RN Case Manager for Aging Gracefully Direct Dial: [phone]

## 2023-10-09 ENCOUNTER — Other Ambulatory Visit: Payer: Self-pay | Admitting: Occupational Therapy

## 2023-10-09 NOTE — Patient Outreach (Signed)
 Aging Gracefully Program  OT Follow-Up Visit  10/09/2023  Ashley Juarez April 08, 1955 [id number]  Visit:  3- Third Visit  Start Time:  1600 End Time:  1630 Total Minutes:  30  Readiness to Change Score :  Readiness to Change Score: 10  Durable Medical Equipment: Durable Medical Equipment Distribution Date:  (said that she did receive her shower seat but is currenlty not using it)  Goals:   Goals Addressed             This Visit's Progress    COMPLETED: Patient Stated       She would like to feel safer and independent in showering. A tub to shower conversion or a tub cut out if applicable would help. Also a shower seat, 2 grab bars (one on long wall-horizontally and one on front wall-vertically) as well as a lower handheld shower holder. MET  Name: [name]   Date: 10/09/2023  OT ACTION PLAN: Bathing   Target Problem Area:   Safety in the bathroom  Why Problem May Occur:    Decreased balance  Pain   Target Goal(s):   Safety with showering and toileting   STRATEGIES   Saving Your Energy DO:  Use a tub seat   Keep frequently used items within easy reach  While seated use handheld shower to get wet and rinse off as much as possible while seated    Modifying your home environment and making it safe DO:  Place a rubber mat in front of seat or non- skid treads  Make sure the bathroom is well-lit    Simplifying the way you set up tasks or daily routines DO:  Plan to bathe/shower before you're overly tired  Gather everything you will need before beginning   PRACTICE  Based on what we have talked about, you are willing to keep trying: To use all the modifications in your remodeled bathroom as needed.  If an idea does not work the first time, try it again (and again). We may make some changes the next session, based on how they work.  Donny Ditch, OTR/L        10/07/2023  Occupational Therapist                        Date        Post Clinical Reasoning: Client  Action (Goal) One Interventions: Client very pleased with new walk in shower Did Client Try?: Yes (she has been using it) Targeted Problem Area Status: A Lot Better Clinician View Of Client Situation:: [name] continues to do well and is enjoying her new walk in shower. Front door is still not locking properly Regulatory affairs officer Of His/Her Situation:: Happy about the shower, wanting to make the front storm door will lock and stay locked Next Visit Plan:: Follow up on door and lower hand held shower holder. Have sent an email to [name] at [employer] about these matters.Also some final education. Hershel

## 2023-10-09 NOTE — Patient Instructions (Signed)
 Name: Ashley Juarez   Date: 10/09/2023  OT ACTION PLAN: Bathing   Target Problem Area:   Safety in the bathroom  Why Problem May Occur:    Decreased balance  Pain   Target Goal(s):   Safety with showering and toileting   STRATEGIES   Saving Your Energy DO:  Use a tub seat   Keep frequently used items within easy reach  While seated use handheld shower to get wet and rinse off as much as possible while seated    Modifying your home environment and making it safe DO:  Place a rubber mat in front of seat or non- skid treads  Make sure the bathroom is well-lit    Simplifying the way you set up tasks or daily routines DO:  Plan to bathe/shower before you're overly tired  Gather everything you will need before beginning   PRACTICE  Based on what we have talked about, you are willing to keep trying: To use all the modifications in your remodeled bathroom as needed.  If an idea does not work the first time, try it again (and again). We may make some changes the next session, based on how they work.  Donny Ditch, OTR/L        10/07/2023  Occupational Therapist                        Date

## 2023-11-23 ENCOUNTER — Encounter: Payer: Self-pay | Admitting: Occupational Therapy

## 2024-02-06 ENCOUNTER — Other Ambulatory Visit: Payer: Self-pay

## 2024-02-06 ENCOUNTER — Telehealth: Payer: Self-pay

## 2024-02-06 NOTE — Patient Outreach (Signed)
 Aging Gracefully Program  02/06/2024  Shelley Cocke Aug 13, 1955 [id number]   [facility] Evaluation Interviewer made contact with patient. Aging Gracefully survey 5 month completed.     Shereen Saunders [facility]  Population Health Care Management Assistant  Direct Dial: [phone]  Fax: [phone] Website: [email or web address]
